# Patient Record
Sex: Female | Born: 1975 | Race: Black or African American | Hispanic: No | Marital: Single | Smoking: Current every day smoker
Health system: Southern US, Community
[De-identification: ages and names within clinical notes are randomized; demographics above are authoritative.]

## PROBLEM LIST (undated history)

## (undated) DIAGNOSIS — I499 Cardiac arrhythmia, unspecified: Secondary | ICD-10-CM

## (undated) DIAGNOSIS — E119 Type 2 diabetes mellitus without complications: Secondary | ICD-10-CM

## (undated) DIAGNOSIS — B009 Herpesviral infection, unspecified: Secondary | ICD-10-CM

## (undated) DIAGNOSIS — I480 Paroxysmal atrial fibrillation: Secondary | ICD-10-CM

## (undated) HISTORY — PX: TUBAL LIGATION: SHX77

## (undated) HISTORY — DX: Paroxysmal atrial fibrillation: I48.0

## (undated) HISTORY — DX: Herpesviral infection, unspecified: B00.9

---

## 1997-12-26 ENCOUNTER — Inpatient Hospital Stay (HOSPITAL_COMMUNITY): Admission: AD | Admit: 1997-12-26 | Discharge: 1997-12-26 | Payer: Self-pay | Admitting: *Deleted

## 1998-03-20 ENCOUNTER — Inpatient Hospital Stay (HOSPITAL_COMMUNITY): Admission: AD | Admit: 1998-03-20 | Discharge: 1998-03-20 | Payer: Self-pay | Admitting: *Deleted

## 1998-06-12 ENCOUNTER — Inpatient Hospital Stay (HOSPITAL_COMMUNITY): Admission: AD | Admit: 1998-06-12 | Discharge: 1998-06-12 | Payer: Self-pay | Admitting: *Deleted

## 1998-09-04 ENCOUNTER — Inpatient Hospital Stay (HOSPITAL_COMMUNITY): Admission: AD | Admit: 1998-09-04 | Discharge: 1998-09-04 | Payer: Self-pay | Admitting: *Deleted

## 1998-11-27 ENCOUNTER — Inpatient Hospital Stay (HOSPITAL_COMMUNITY): Admission: AD | Admit: 1998-11-27 | Discharge: 1998-11-27 | Payer: Self-pay | Admitting: *Deleted

## 1999-02-19 ENCOUNTER — Inpatient Hospital Stay (HOSPITAL_COMMUNITY): Admission: AD | Admit: 1999-02-19 | Discharge: 1999-02-19 | Payer: Self-pay | Admitting: *Deleted

## 1999-05-13 ENCOUNTER — Inpatient Hospital Stay (HOSPITAL_COMMUNITY): Admission: AD | Admit: 1999-05-13 | Discharge: 1999-05-13 | Payer: Self-pay | Admitting: *Deleted

## 1999-05-21 ENCOUNTER — Encounter: Admission: RE | Admit: 1999-05-21 | Discharge: 1999-05-21 | Payer: Self-pay | Admitting: Obstetrics & Gynecology

## 1999-11-28 ENCOUNTER — Inpatient Hospital Stay (HOSPITAL_COMMUNITY): Admission: AD | Admit: 1999-11-28 | Discharge: 1999-11-28 | Payer: Self-pay | Admitting: *Deleted

## 2000-02-13 ENCOUNTER — Encounter: Admission: RE | Admit: 2000-02-13 | Discharge: 2000-02-13 | Payer: Self-pay | Admitting: Hematology and Oncology

## 2000-02-25 ENCOUNTER — Inpatient Hospital Stay (HOSPITAL_COMMUNITY): Admission: AD | Admit: 2000-02-25 | Discharge: 2000-02-25 | Payer: Self-pay | Admitting: *Deleted

## 2000-06-19 ENCOUNTER — Inpatient Hospital Stay (HOSPITAL_COMMUNITY): Admission: AD | Admit: 2000-06-19 | Discharge: 2000-06-19 | Payer: Self-pay | Admitting: *Deleted

## 2000-07-21 ENCOUNTER — Emergency Department (HOSPITAL_COMMUNITY): Admission: EM | Admit: 2000-07-21 | Discharge: 2000-07-21 | Payer: Self-pay | Admitting: Emergency Medicine

## 2000-07-30 ENCOUNTER — Emergency Department (HOSPITAL_COMMUNITY): Admission: EM | Admit: 2000-07-30 | Discharge: 2000-07-30 | Payer: Self-pay | Admitting: Emergency Medicine

## 2000-10-21 ENCOUNTER — Inpatient Hospital Stay (HOSPITAL_COMMUNITY): Admission: AD | Admit: 2000-10-21 | Discharge: 2000-10-21 | Payer: Self-pay | Admitting: *Deleted

## 2000-10-22 ENCOUNTER — Emergency Department (HOSPITAL_COMMUNITY): Admission: EM | Admit: 2000-10-22 | Discharge: 2000-10-22 | Payer: Self-pay | Admitting: Emergency Medicine

## 2001-02-21 ENCOUNTER — Emergency Department (HOSPITAL_COMMUNITY): Admission: EM | Admit: 2001-02-21 | Discharge: 2001-02-21 | Payer: Self-pay | Admitting: Emergency Medicine

## 2001-03-10 ENCOUNTER — Inpatient Hospital Stay (HOSPITAL_COMMUNITY): Admission: RE | Admit: 2001-03-10 | Discharge: 2001-03-10 | Payer: Self-pay | Admitting: *Deleted

## 2001-08-02 ENCOUNTER — Other Ambulatory Visit: Admission: RE | Admit: 2001-08-02 | Discharge: 2001-08-02 | Payer: Self-pay | Admitting: Family Medicine

## 2002-02-02 ENCOUNTER — Emergency Department (HOSPITAL_COMMUNITY): Admission: EM | Admit: 2002-02-02 | Discharge: 2002-02-02 | Payer: Self-pay | Admitting: Emergency Medicine

## 2002-12-29 ENCOUNTER — Emergency Department (HOSPITAL_COMMUNITY): Admission: EM | Admit: 2002-12-29 | Discharge: 2002-12-29 | Payer: Self-pay

## 2003-02-19 ENCOUNTER — Emergency Department (HOSPITAL_COMMUNITY): Admission: EM | Admit: 2003-02-19 | Discharge: 2003-02-19 | Payer: Self-pay | Admitting: Emergency Medicine

## 2004-02-02 ENCOUNTER — Other Ambulatory Visit: Admission: RE | Admit: 2004-02-02 | Discharge: 2004-02-02 | Payer: Self-pay | Admitting: Obstetrics and Gynecology

## 2004-07-05 ENCOUNTER — Other Ambulatory Visit: Admission: RE | Admit: 2004-07-05 | Discharge: 2004-07-05 | Payer: Self-pay | Admitting: Obstetrics and Gynecology

## 2005-11-26 ENCOUNTER — Inpatient Hospital Stay (HOSPITAL_COMMUNITY): Admission: RE | Admit: 2005-11-26 | Discharge: 2005-11-29 | Payer: Self-pay | Admitting: Obstetrics and Gynecology

## 2005-11-26 ENCOUNTER — Encounter (INDEPENDENT_AMBULATORY_CARE_PROVIDER_SITE_OTHER): Payer: Self-pay | Admitting: Specialist

## 2006-02-03 ENCOUNTER — Other Ambulatory Visit: Admission: RE | Admit: 2006-02-03 | Discharge: 2006-02-03 | Payer: Self-pay | Admitting: Obstetrics and Gynecology

## 2007-01-18 ENCOUNTER — Encounter: Admission: RE | Admit: 2007-01-18 | Discharge: 2007-01-18 | Payer: Self-pay | Admitting: Family Medicine

## 2007-03-02 ENCOUNTER — Ambulatory Visit (HOSPITAL_COMMUNITY): Admission: RE | Admit: 2007-03-02 | Discharge: 2007-03-02 | Payer: Self-pay | Admitting: Obstetrics and Gynecology

## 2007-03-02 ENCOUNTER — Encounter (INDEPENDENT_AMBULATORY_CARE_PROVIDER_SITE_OTHER): Payer: Self-pay | Admitting: Obstetrics and Gynecology

## 2009-03-17 IMAGING — CT CT ABDOMEN W/O CM
3 of 4 series · 15 of 32 positions shown, 20 images · IV contrast (READICAT/WATER)
Comparison: none

CLINICAL DATA: Abdominal pain.  Recent onset UTI.

 ABDOMEN CT WITHOUT CONTRAST:
TECHNIQUE: Multidetector CT imaging of the abdomen was performed following the standard protocol without IV contrast.    Oral Gastrografin was administered.

[Series 2: abdomen w/ · axial · 0.70mm/px · z∈[-162,+23]mm · 4 of 63 slices shown, 9 images]
[im 13/63  soft-tissue]
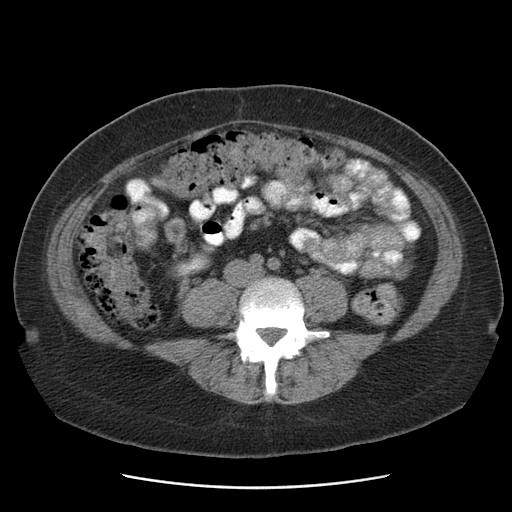
[im 13/63  lung]
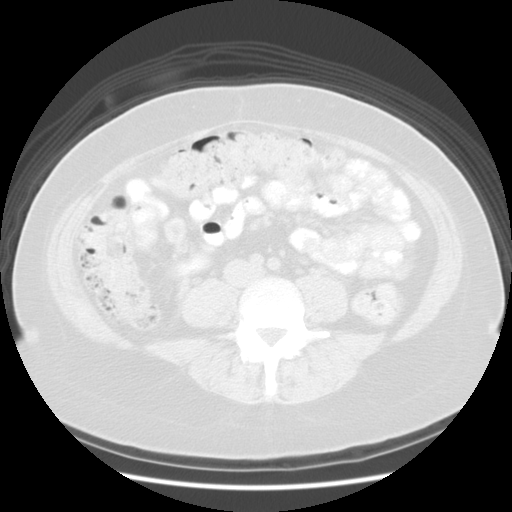
[im 13/63  bone]
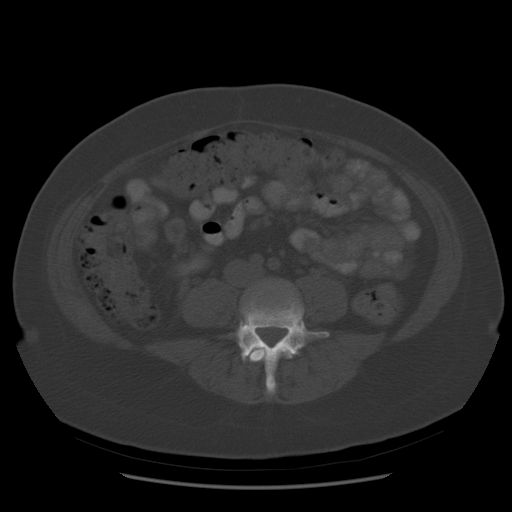
[im 25/63  soft-tissue]
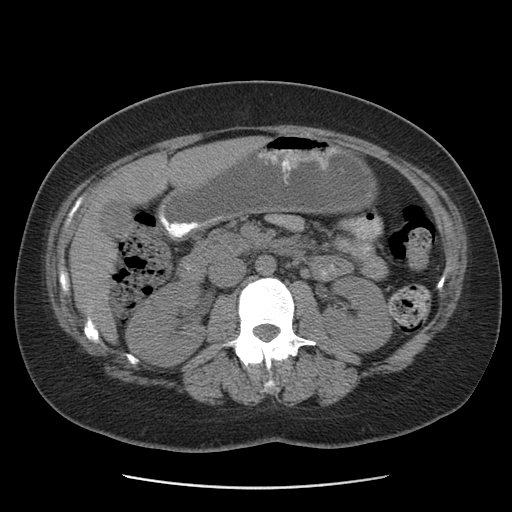
[im 25/63  lung]
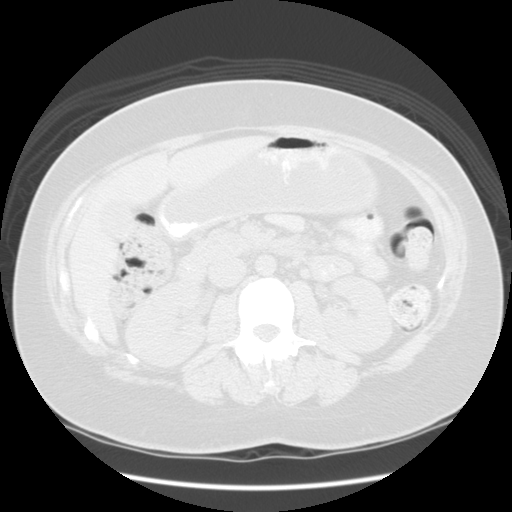
[im 38/63  soft-tissue]
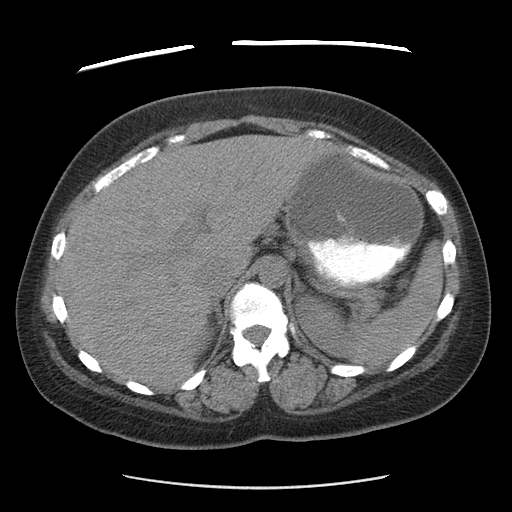
[im 38/63  lung]
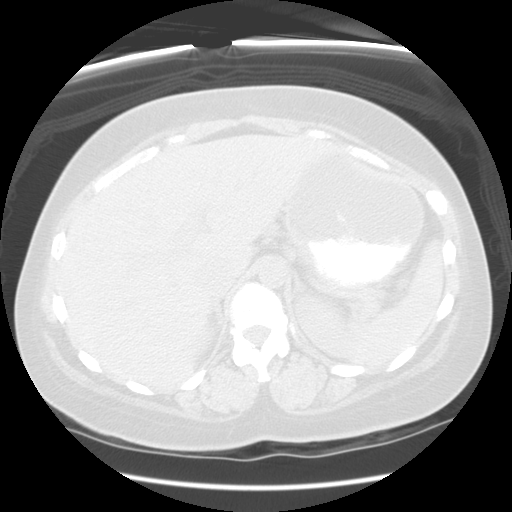
[im 50/63  soft-tissue]
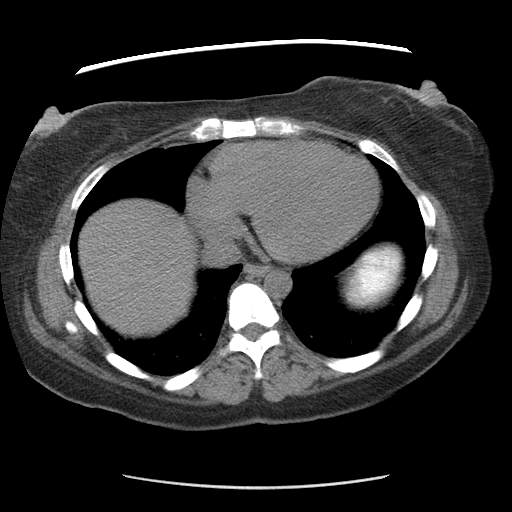
[im 50/63  lung]
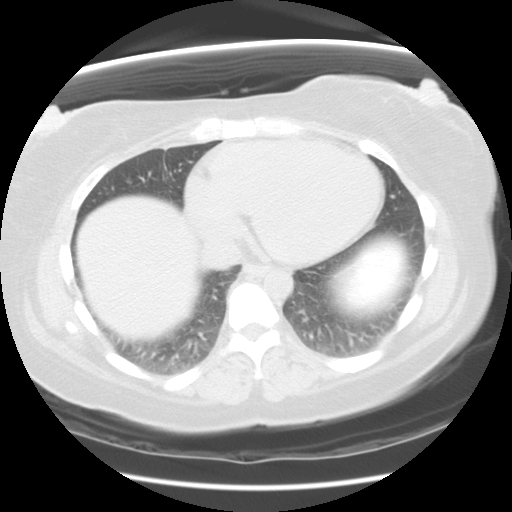

[Series 400: coronal · coronal · 0.70mm/px · 3 of 103 slices shown]
[im 12/103  soft-tissue]
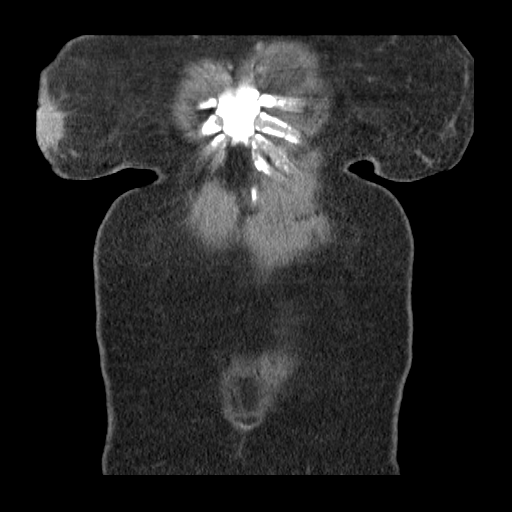
[im 23/103  soft-tissue]
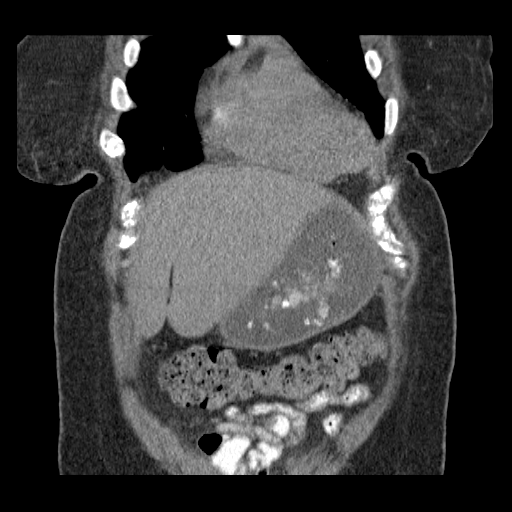
[im 35/103  soft-tissue]
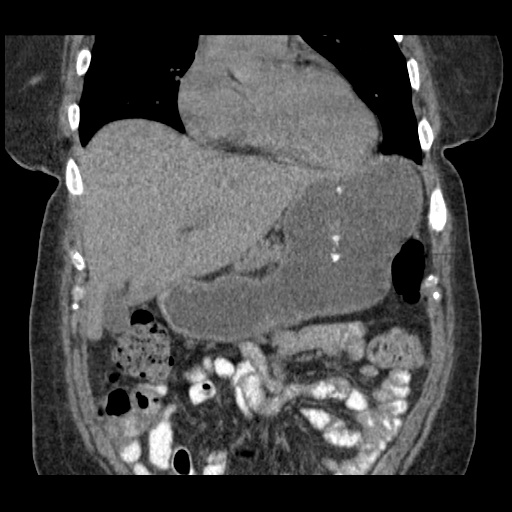

[Series 401: sagittal · sagittal · 0.70mm/px · 8 of 131 slices shown]
[im 11/131  soft-tissue]
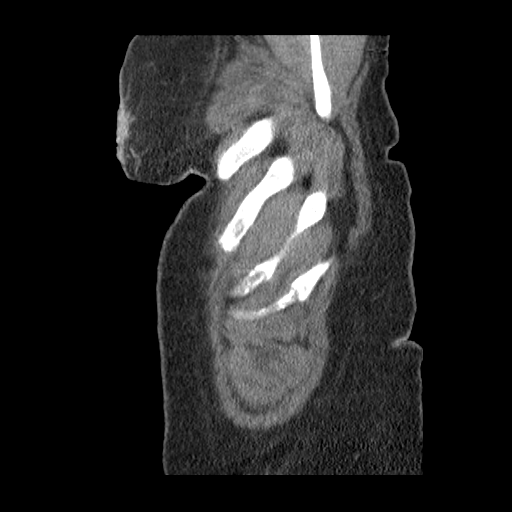
[im 33/131  soft-tissue]
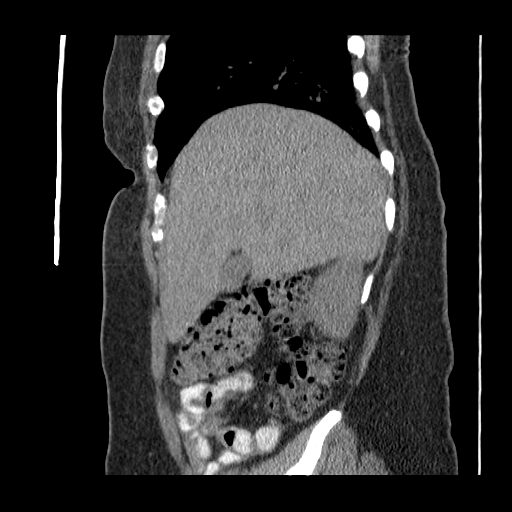
[im 44/131  soft-tissue]
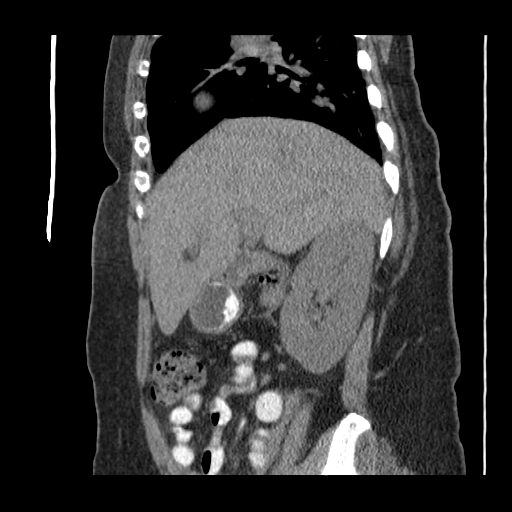
[im 55/131  soft-tissue]
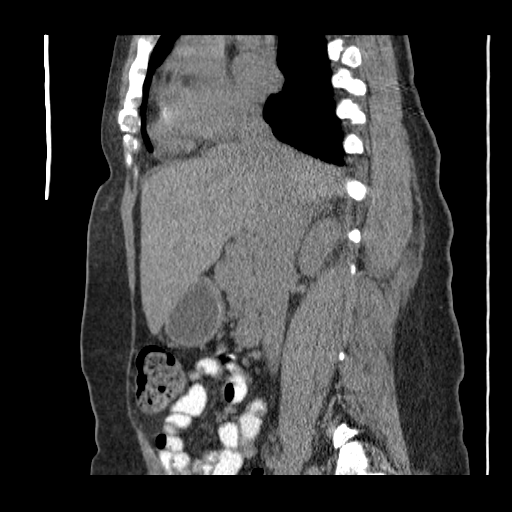
[im 76/131  soft-tissue]
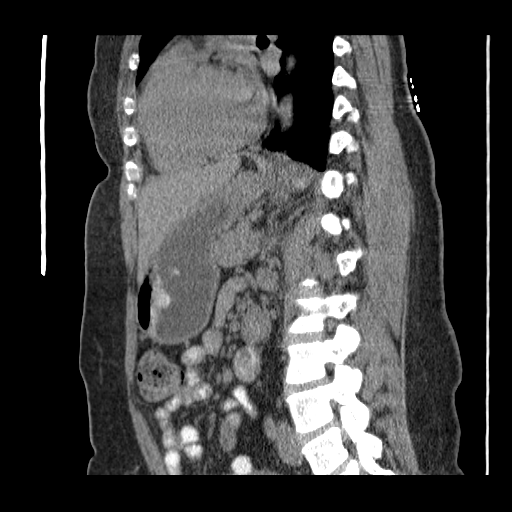
[im 87/131  soft-tissue]
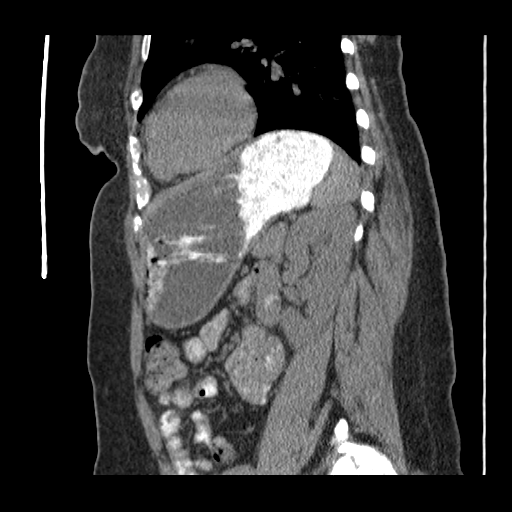
[im 98/131  soft-tissue]
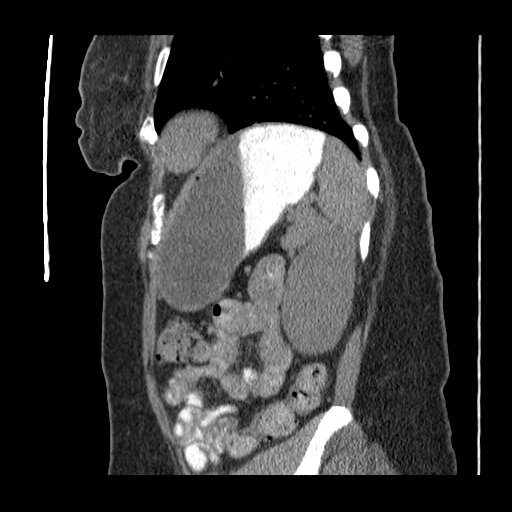
[im 120/131  soft-tissue]
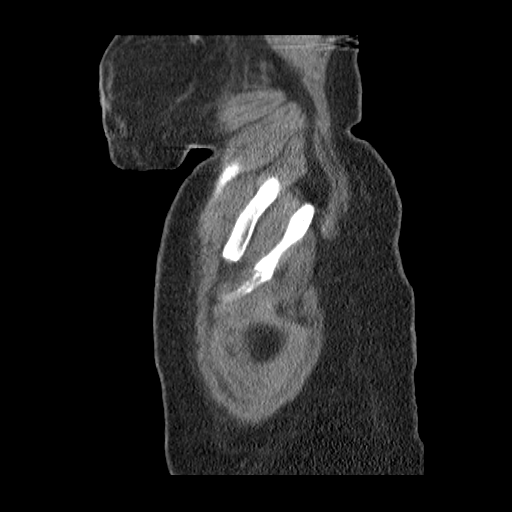

[15 of 32 positions shown; findings below may reference images not displayed]

FINDINGS: Unremarkable appearance of the liver, spleen, pancreas, kidneys, and adrenal glands.  No abdominal mass or abnormal fluid collection.  Generous amount of stool throughout the colon.
IMPRESSION: No acute abdominal findings.  Probable constipation.

## 2010-01-25 ENCOUNTER — Encounter: Admission: RE | Admit: 2010-01-25 | Discharge: 2010-01-25 | Payer: Self-pay | Admitting: Family Medicine

## 2011-02-11 NOTE — Op Note (Signed)
Wendy Ramsey, Wendy Ramsey               ACCOUNT NO.:  [id number]   MEDICAL RECORD NO.:  [id number]          PATIENT TYPE:  AMB   LOCATION:  SDC                           FACILITY:  [facility]   PHYSICIAN:  Naima A. Dillard, M.D. DATE OF BIRTH:  September 15, 1976   DATE OF PROCEDURE:  03/02/2007  DATE OF DISCHARGE:                               OPERATIVE REPORT   PREOPERATIVE DIAGNOSIS:  Menorrhagia and uterine fibroids.   POSTOPERATIVE DIAGNOSIS:  Menorrhagia and uterine fibroids.   PROCEDURE:  D&C hysteroscopy, ThermaChoice ablation.   SURGEON:  Naima A. Normand Sloop, M.D., no assistants.   ANESTHESIA:  General laryngeal mask airway.   SPECIMENS:  Endometrial curettings.   ESTIMATED BLOOD LOSS:  Minimal.   DEFICIT:  100 mL for hysteroscopic fluid.   COMPLICATIONS:  None.  The patient went to PACU in stable condition.   The patient was brought to the operating room and placed dorsal  lithotomy position, prepped and prepped and draped in normal sterile  fashion after she was put to sleep.  The bladder was drained with  straight catheter.  The uterus was noted to be about eight weeks size  anteverted with no adnexal masses.  A bivalve speculum placed into the  vagina.  The anterior lip of the cervix grasped with single-tooth  tenaculum.  The uterus did sound to 9 cm.  The cervix was dilated Pratt  dilators up to 21.  Hysteroscope was placed into the uterine cavity.  All four walls of the uterus were normal.  Both ostia were visualized.  The hysteroscope was removed, sharp endometrial curetting was done,  endometrial curettings were sent to pathology.  The ThermaChoice  ablation was done per protocol without difficulty.  Upon looking with  the hysteroscope, most of the ablation to take place, however, on both  sides of the cornua.  There were some pink tissue there.  All  instruments removed from the vagina.  The tenaculum sites on the  patient's left side was made hemostatic with silver nitrate  and  pressure.  Sponge, lap and needle counts were correct.  The patient to  recovery room in stable condition.      Naima A. Normand Sloop, M.D.  Electronically Signed     NAD/MEDQ  D:  03/02/2007  T:  03/02/2007  Job:  [id number]

## 2011-02-11 NOTE — H&P (Signed)
Wendy Ramsey, Wendy Ramsey               ACCOUNT NO.:  [id number]   MEDICAL RECORD NO.:  [id number]          PATIENT TYPE:  AMB   LOCATION:  SDC                           [facility]   PHYSICIAN:  Naima A. Dillard, M.D. DATE OF BIRTH:  Oct 19, 1975   DATE OF ADMISSION:  DATE OF DISCHARGE:                              HISTORY & PHYSICAL   DIAGNOSES:  Dysmenorrhea and menorrhagia.   Patient presented to me in May, 2007 complaining of heavy vaginal  bleeding status post C-section in February, 2007.  Her menses were every  month.  She said she soaked greater than a pad an hour and occasionally  felt dizzy with her menses.  She denied any history of bleeding  disorder.  A year later, the patient says her periods are ever 28-30  days and last for 5-7 days.  She does not change her pad quite as often,  changes them 4-5 times a day but she says it interferes with her normal  routine and occasionally has bleeding through her clothes.  She does  have some lower abdominal pain that is intermittent and occasionally  happens after urination.  The pain goes from an 8/10 to about a 3/10  with Motrin.  The patient denies any vaginal discharge, odor, fever.  She does have occasional pain with intercourse.  She denies any urgency  or dysuria.  She does have some urinary trace blood in her urine on UA  but not grossly.  Patient denies any history of kidney stones,  constipation, diarrhea, rectal bleeding, vomiting, nausea, pregnancy.  She does have fibroids, ultrasounds.  Denies any endometriosis.  Patient  has a history of ovarian cyst.  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   Patient had an ultrasound which demonstrated an uterus measuring 8.54 x  3.54 x 4.06.  Both ovaries were normal.  Patient had a small fibroid  measuring 1.24 cm.   PAST MEDICAL HISTORY:  Significant for herpes type II.   ALLERGIES:  HYDROCODONE, LASIX.   PAST SURGICAL HISTORY:  C-sections x2 and tubal ligation.   Patient does smoke  a half pack per day.  She denies any illegal drug  use.   FAMILY HISTORY:  Significant for diabetes and high blood pressure.   PERTINENT LABORATORY DATA:  Hemoglobin is 15.3.  Pap smear is found to  be within normal limits.  Urine pregnancy test was found to be negative.   REVIEW OF SYSTEMS:  Genitourinary is significant for menorrhagia,  occasional dysmenorrhea with pelvic pain.  MUSCULOSKELETAL:  There is no  weakness.  She does have some joint pain occasionally.  Endocrine is  unremarkable.  Psychiatric is unremarkable.   PHYSICAL EXAMINATION:  VITAL SIGNS:  Patient is 171 pounds.  Blood  pressure is 112/80.  HEENT:  Pupils are equal.  Ears are normal.  Throat is clear.  NECK:  Thyroid is not enlarged.  HEART:  Regular rate and rhythm.  LUNGS:  Clear to auscultation bilaterally.  BREASTS:  No masses, discharge, skin changes, or nipple retraction  bilaterally.  BACK:  No CVA tenderness bilaterally.  ABDOMEN:  Nontender without mass or organomegaly.  EXTREMITIES:  No clubbing, cyanosis or edema.  NEURO:  Within normal limits.  PELVIC:  Within normal limits.  Uterus normal shape, size, consistency,  and nontender.  Cervix is nontender without any lesions, but it is  stenotic.  Adnexa has no masses and nontender.   ASSESSMENT:  Menorrhagia, occasional pelvic pain.  Patient feels like  the bleeding interferes with her normal routine.  All treatments were  reviewed for the patient for having menses from observation to pain  medications to birth control pills, ablation, hysterectomy.  Patient  desired endometrial ablation.  She understands the risks are not limited  to bleeding, infection, damage to internal organs such as bowel,  bladder, major blood vessels, perforation of the uterus.  The patient  agrees and desires to proceed.      Naima A. Normand Sloop, M.D.  Electronically Signed     NAD/MEDQ  D:  03/02/2007  T:  03/02/2007  Job:  [id number]

## 2011-02-14 NOTE — Op Note (Signed)
Wendy Ramsey, Wendy Ramsey               ACCOUNT NO.:  [id number]   MEDICAL RECORD NO.:  [id number]          PATIENT TYPE:  INP   LOCATION:  9122                          FACILITY:  [facility]   PHYSICIAN:  Naima A. Dillard, M.D. DATE OF BIRTH:  Oct 11, 1975   DATE OF PROCEDURE:  11/26/2005  DATE OF DISCHARGE:                                 OPERATIVE REPORT   PREOPERATIVE DIAGNOSES:  Term. Desires repeat cesarean section and  sterilization.   POSTOPERATIVE DIAGNOSES:  Term. Desires repeat cesarean section and  sterilization.   OPERATION/PROCEDURE:  1.  Repeat cesarean section.  2.  Bilateral tubal ligation.   SURGEON:  Naima A. Normand Sloop, M.D.   ASSISTANT:  Elby Showers. Williams, C.N.M.   ANESTHESIA:  Spinal.   SPECIMENS:  Placenta sent to labor and delivery.   OPERATIVE FINDINGS:  The patient had a female infant, Apgars of 8 and 9 and  normal abdominal anatomy and tubes and ovaries bilaterally.   ESTIMATED BLOOD LOSS:  600 mL.   URINARY OUTPUT:  400 mL clear urine at the end of the procedure.   IV FLUIDS:  2800 mL crystalloid.   COMPLICATIONS:  None.   CONDITION:  The patient went to PACU in stable condition.   DESCRIPTION OF PROCEDURE:  The patient was taken to the operating room.  She  was given spinal anesthesia, placed in the dorsal supine position with a  left lateral tilt.  The patient was prepped and draped in the normal sterile  fashion.  Foley catheter was placed.  Once her anesthesia was found to be  adequate, Pfannenstiel skin incision was made along per prior incision and  carried down to the with the scalpel and carried down to the fascia.  The  fascia was incised in the midline and extended bilaterally.  Kochers x2 were  placed in the superior aspect of the fascia which was dissected off the  rectus muscles both sharply and bluntly.  The superior aspect of the fascia  was dissected off the rectus muscle in a similar fashion.  The muscles were  separated in the midline.   The peritoneum was identified, tented up and  entered sharply and muscles separated in the midline.  The peritoneum was  extended superiorly and inferior with good visualization of bowel and  bladder.  The bladder blade was inserted. Vesicouterine peritoneum  was  identified, tented up sharply and extended bilaterally.  The bladder blade  was reinserted and low transverse uterine incision was made with the scalpel  and extended bluntly.  The amniotic sac was ruptured using forceps.  The  infant was delivered without difficulty.  Mouth and nares were bulb-  suctioned.  There was clear fluid.  Cord was clamped and cut.  The placenta  was sent for cord blood donation.  The placenta was manually delivered  without difficulty. The uterus was cleared of all clot and debris.  Uterine  incision was repaired with 0 Vicryl in a running locked fashion.  A second  layer of 0 Vicryl was used to imbricate the uterus.  Hemostasis was assured.   Attention  was then turned to the tubes.  The patient's left fallopian tube  was grasped and about 1.5 cm of mid isthmic portion of the tube was ligated  with 2-0 plain and excised.  Hemostasis was assured.  The patient's right  fallopian tube was grasped in the similar fashion and about 1.5 cm of the  mid isthmic portion of the tube was ligated with 2-0 plain and excised.  Irrigation was done.   There was some bleeding along the midline of the uterine incision which was  repaired with 0 Vicryl figure-of-eight stitch.  Hemostasis was assured.  Irrigation was then done again.  The peritoneum was closed with 0 chromic.  The fascia was closed with 0 Vicryl in a running fashion.  The subcutaneous  tissue was made hemostatic with the Bovie cautery after irrigation and  reapproximated with 2-0 plain.  Jackson-Pratt drain was placed.  Skin was  closed with 3-0 Monocryl in subcuticular fashion.  Sponge, lap and needle  counts were correct x2.  The patient went to the  recovery room in stable  condition.      Naima A. Normand Sloop, M.D.  Electronically Signed     NAD/MEDQ  D:  11/26/2005  T:  11/27/2005  Job:  [id number]

## 2011-02-14 NOTE — H&P (Signed)
Wendy Ramsey, Wendy Ramsey               ACCOUNT NO.:  [id number]   MEDICAL RECORD NO.:  [id number]          PATIENT TYPE:  INP   LOCATION:  NA                            [facility]   PHYSICIAN:  Naima A. Dillard, M.D. DATE OF BIRTH:  May 18, 1976   DATE OF ADMISSION:  DATE OF DISCHARGE:                                HISTORY & PHYSICAL   The patient is a 35 year old gravida 2, para 1-0-0-1, with a due date of  December 04, 2005, by 14-week ultrasound presenting for repeat cesarean section  and tubal ligation.  The patient's pregnancy has been complicated by history  of a previous cesarean section and the patient has decided not to have a  vaginal birth after cesarean section and proceed with repeat cesarean  section.  She also does not want anymore children and has desired a tubal  ligation for birth control.  The patient has a history of HSV II and she is  currently on Valtrex prophylaxis.  The patient is GBS positive.  The patient  is also a smoker and we encouraged tobacco cessation.   PRENATAL LABORATORY DATA:  HIV was nonreactive.  RPR was nonreactive.  The  patient has O positive blood type with antibodies negative.  She is rubella  immune.  She declined a quad screen.  Hepatitis B surface antigen was  nonreactive.  Sickle cell was negative.  Hemoglobin 12.5, platelets 321.  Gonorrhea and Chlamydia are both negative.  The patient declines quad  screen.  Group B Strep was positive.   PAST OBSTETRICAL HISTORY:  Significant for October of 1997 the patient had a  female infant weighing 6 pounds 5 ounces at term.  She had a cesarean section  for nonreassuring fetal heart tones.   PAST GYN HISTORY:  Significant for menarche at age 82 occurring every month  and lasting for 5 days.  The patient says she has a history of Trichomonas  and yeast infections and also herpes.  The patient denies any history of  abnormal Pap smear.   ALLERGIES:  LATEX sensitivity.   MEDICATIONS:  Valtrex and  prenatal vitamins.   FAMILY HISTORY:  Significant for maternal grandmother with hypertension and  maternal grandmother with type 2 diabetes.   GENETIC HISTORY:  Unremarkable.   PHYSICAL EXAMINATION:  VITAL SIGNS:  The patient is 5 feet 4 inches and  weighs 205 pounds.  Blood pressure 120/80.  ABDOMEN:  Gravid, soft, and nontender measuring 38 cm.  SKIN:  Normal.  NEUROLOGY:  Within normal limits.  EXTREMITIES:  Trace edema with no cyanosis or clubbing bilaterally on lower  extremities.  HEENT:  Head is normocephalic and atraumatic.  Thyroid is not enlarged.  BREASTS:  No masses bilaterally.  CARDIOVASCULAR:  Regular rate and rhythm.  LUNGS:  Clear to auscultation bilaterally.  PELVIC:  Last cervical examination was long and closed.  Uterus measured 38  cm.   ASSESSMENT:  Pregnancy at 38-6/7 weeks, desires repeat cesarean section and  tubal ligation.  The patient understands the risks are, but not limited to  bleeding, infection, damage to internal organs such  as bowel, bladder, and  major blood vessels.  The patient understands that the risks of tubal  ligation are failure rate of about 1 in 200 to 1 in 300 and half of those  could result in ectopic pregnancy.  The patient still desires to proceed.      Naima A. Normand Sloop, M.D.  Electronically Signed     NAD/MEDQ  D:  11/25/2005  T:  11/25/2005  Job:  [id number]

## 2011-02-14 NOTE — H&P (Signed)
[facility]  Patient:    Wendy Ramsey, Wendy Ramsey                      MRN: [id number] Adm. Date:  [id number] Disc. Date: [id number] Attending:  Deniece Ree                         History and Physical  HISTORY OF PRESENT ILLNESS:   This is a 35 year old separated white female gravida 0, para 0 who has been having chronic right lower quadrant pain since February of 1999. She had a complete GI evaluation with a colonoscopy and upper GI series, and they were normal. She had a laparoscopy done which showed widespread endometriosis. She had treatment with Lupron for two months which brought her no relief. She has gone to the pain center at [facility] since January of 2000. She has pain that is so severe that she feels like she cannot function, and the pain center has helped her significantly. She had a pelvic ultrasound by me on July 16 which showed a 3.8 x 2.1 cm mass in the left adnexa with shadowing consistent with a dermoid cyst. She had a much smaller cyst 11 x 12 on the right adnexa. The uterus was normal. The options for therapy were discussed with this patient, a repeat laparoscopy with left oophorectomy or TAH/BSO in light of her endometriosis. The patient elected conservative laparoscopy approach with a laparoscopic LSO and then surgery later for the TAH if she continues to have pain.  PAST MEDICAL HISTORY:         Positive for severe depression under therapy and chronic low back pain. She also has had three previous laparoscopies and an arthroscopy.  ALLERGIES:                    PENICILLIN, SULFA DRUGS, BUPRENEX, and ULTRAM.  SOCIAL HISTORY:               She smokes one to one and a half packs per day.  PHYSICAL EXAMINATION:  VITAL SIGNS:                  Normal.  HEENT:                        Within normal limits.  LUNGS:                        Clear to A&P.  HEART:                        Regular rate and rhythm without murmurs, rubs, or  gallops.  ABDOMEN:                      Soft, flat, and nontender.  EXTREMITIES:                  Normal.  PELVIC:                       External vagina, cervix normal. On bimanual, the uterus was anterior, small, mobile, and mildly tender. The adnexa were without masses, but both were also mildly tender. The known pelvic mass could not be appreciated on exam.  ASSESSMENT:  A 4 cm left adnexal mass, possible dermoid cyst with chronic pelvic pain and endometriosis found on previous laparoscopy.  PLAN:                         Laparoscopy with left salpingo-oophorectomy, possible laser vaporization of endometriosis.DD:  05/08/00 TD:  05/08/00 Job: [id number]

## 2011-02-14 NOTE — Discharge Summary (Signed)
Wendy Ramsey, Wendy Ramsey               ACCOUNT NO.:  [id number]   MEDICAL RECORD NO.:  [id number]          PATIENT TYPE:  INP   LOCATION:  9122                          FACILITY:  [facility]   PHYSICIAN:  Naima A. Dillard, M.D. DATE OF BIRTH:  1976/02/04   DATE OF ADMISSION:  11/26/2005  DATE OF DISCHARGE:  11/29/2005                                 DISCHARGE SUMMARY   ADMITTING DIAGNOSES:  1.  Intrauterine pregnancy at 46 and 6/7 weeks.  2.  Desires repeat cesarean section and tubal ligation.   DISCHARGE DIAGNOSES:  1.  Intrauterine pregnancy at 59 and 6/7 weeks.  2.  Desires repeat cesarean section and tubal ligation.   PROCEDURES:  1.  Repeat transverse cesarean section.  2.  Bilateral tubal ligation.   HOSPITAL COURSE:  Wendy Ramsey is a 35 year old gravida 2, para 1-0-0-1 with an  EDC of December 04, 2005, which was determined by a 14-week ultrasound.  She was  admitted for repeat cesarean section and she also desired sterilization.  Her pregnancy has been followed by the [facility] M.D[facility]  and has been remarkable for:  1.  History of previous cesarean section.  2.  Desires tubal ligation.  3.  History of HSV 2 and on Valtrex prophylaxis.  4.  Group B strep positive.  5.  Smoker.   The patient tolerated the cesarean section well.  Infant was a viable female  with Apgars of 8 and 9, and weight of 5 pounds and 4 ounces.  Infant was  taken to the full-term nursery in good condition.  The patient tolerated the  cesarean section, as well as the tubal ligation without any problems.  She  was taken to the recovery room and was doing well.  By postop day #1, her  hemoglobin was 11.3 and had been 13.8 preoperatively.  She was ambulating  without difficulty.  Baby was bottle feeding.  By postop day #2, her JP  drain had only 15 mL of drainage in 24 hours.  The patient continued to do  well.  By postop day #3, she had some blood pressures of 128/86 and 124/81.  Her other vital  signs were stable and she was afebrile.  Her incision was  clean and dry.  Her JP drain was removed.  She was deemed receive the full  benefit of her hospital stay and was discharged home.   DISCHARGE INSTRUCTIONS:  Per [facility] handout.   DISCHARGE MEDICATIONS:  1.  Motrin 600 mg 1 p.o. q.6h. p.r.n. pain.  2.  Tylox 1-2 p.o. q.3-4h. p.r.n. pain.   DISCHARGE FOLLOWUP:  Will occur at [facility] in 4-6 weeks or  as needed.      Cam Hai, C.N.M.      Naima A. Normand Sloop, M.D.  Electronically Signed    KS/MEDQ  D:  11/29/2005  T:  11/29/2005  Job:  [id number]

## 2011-07-17 LAB — CBC
HCT: 39.8
Hemoglobin: 13.6
MCHC: 34.2
MCV: 94.8
Platelets: 310
RBC: 4.21
RDW: 12.8
WBC: 5.7

## 2011-07-17 LAB — PREGNANCY, URINE: Preg Test, Ur: NEGATIVE

## 2011-09-26 ENCOUNTER — Ambulatory Visit (INDEPENDENT_AMBULATORY_CARE_PROVIDER_SITE_OTHER): Payer: BC Managed Care – PPO

## 2011-09-26 DIAGNOSIS — L299 Pruritus, unspecified: Secondary | ICD-10-CM

## 2012-07-05 ENCOUNTER — Encounter: Payer: Self-pay | Admitting: Obstetrics and Gynecology

## 2012-07-05 ENCOUNTER — Telehealth: Payer: Self-pay | Admitting: Obstetrics and Gynecology

## 2012-07-05 ENCOUNTER — Ambulatory Visit (INDEPENDENT_AMBULATORY_CARE_PROVIDER_SITE_OTHER): Payer: BC Managed Care – PPO | Admitting: Obstetrics and Gynecology

## 2012-07-05 VITALS — BP 118/64 | HR 82 | Wt 192.0 lb

## 2012-07-05 DIAGNOSIS — R309 Painful micturition, unspecified: Secondary | ICD-10-CM

## 2012-07-05 DIAGNOSIS — R300 Dysuria: Secondary | ICD-10-CM

## 2012-07-05 DIAGNOSIS — B9689 Other specified bacterial agents as the cause of diseases classified elsewhere: Secondary | ICD-10-CM

## 2012-07-05 DIAGNOSIS — N760 Acute vaginitis: Secondary | ICD-10-CM

## 2012-07-05 DIAGNOSIS — A499 Bacterial infection, unspecified: Secondary | ICD-10-CM

## 2012-07-05 LAB — POCT WET PREP (WET MOUNT)
Bacteria Wet Prep HPF POC: POSITIVE
Clue Cells Wet Prep Whiff POC: POSITIVE
pH: 5

## 2012-07-05 LAB — POCT URINALYSIS DIPSTICK
Bilirubin, UA: NEGATIVE
Blood, UA: 3
Glucose, UA: NEGATIVE
Ketones, UA: NEGATIVE
Nitrite, UA: NEGATIVE
Protein, UA: 2
Spec Grav, UA: 1.015
Urobilinogen, UA: NEGATIVE
pH, UA: 5

## 2012-07-05 LAB — POCT URINE PREGNANCY: Preg Test, Ur: NEGATIVE

## 2012-07-05 MED ORDER — TINIDAZOLE 500 MG PO TABS: 2.0000 g | ORAL_TABLET | Freq: Every day | ORAL | Status: DC

## 2012-07-05 NOTE — Telephone Encounter (Signed)
Tc to pt regarding msg, lm on vm to call back. 

## 2012-07-05 NOTE — Progress Notes (Signed)
C/o not voiding normally  Filed Vitals:   07/05/12 1539  BP: 118/64  Pulse: 82   Results for orders placed in visit on 07/05/12  POCT URINALYSIS DIPSTICK      Component Value Range   Color, UA yellow     Clarity, UA       Glucose, UA neg     Bilirubin, UA neg     Ketones, UA neg     Spec Grav, UA 1.015     Blood, UA 3     pH, UA 5.0     Protein, UA 2     Urobilinogen, UA negative     Nitrite, UA neg     Leukocytes, UA moderate (2+)     ROS: noncontributory  Pelvic exam:  VULVA: normal appearing vulva with no masses, tenderness or lesions,  VAGINA: normal appearing vagina with normal color and discharge, no lesions, white d/c CERVIX: normal appearing cervix without discharge or lesions,  UTERUS: uterus is normal size, shape, consistency and nontender,  ADNEXA: normal adnexa in size, nontender and no masses.  Results for orders placed in visit on 07/05/12  POCT URINALYSIS DIPSTICK      Component Value Range   Color, UA yellow     Clarity, UA       Glucose, UA neg     Bilirubin, UA neg     Ketones, UA neg     Spec Grav, UA 1.015     Blood, UA 3     pH, UA 5.0     Protein, UA 2     Urobilinogen, UA negative     Nitrite, UA neg     Leukocytes, UA moderate (2+)    POCT URINE PREGNANCY      Component Value Range   Preg Test, Ur Negative    POCT WET PREP (WET MOUNT)      Component Value Range   Source Wet Prep POC       WBC, Wet Prep HPF POC       Bacteria Wet Prep HPF POC pos     BACTERIA WET PREP MORPHOLOGY POC       Clue Cells Wet Prep HPF POC Moderate     CLUE CELLS WET PREP WHIFF POC Positive Whiff     Yeast Wet Prep HPF POC       KOH Wet Prep POC       Trichomonas Wet Prep HPF POC       pH 5.0        A/P Wet prep - BV - tindamax Urine Cx AEX

## 2012-09-16 ENCOUNTER — Ambulatory Visit: Payer: Self-pay | Admitting: Obstetrics and Gynecology

## 2012-10-11 ENCOUNTER — Encounter: Payer: Self-pay | Admitting: Obstetrics and Gynecology

## 2012-10-11 ENCOUNTER — Ambulatory Visit (INDEPENDENT_AMBULATORY_CARE_PROVIDER_SITE_OTHER): Payer: BC Managed Care – PPO | Admitting: Obstetrics and Gynecology

## 2012-10-11 VITALS — BP 118/76 | Ht 62.0 in | Wt 195.0 lb

## 2012-10-11 DIAGNOSIS — Z124 Encounter for screening for malignant neoplasm of cervix: Secondary | ICD-10-CM

## 2012-10-11 DIAGNOSIS — Z01419 Encounter for gynecological examination (general) (routine) without abnormal findings: Secondary | ICD-10-CM

## 2012-10-11 DIAGNOSIS — N921 Excessive and frequent menstruation with irregular cycle: Secondary | ICD-10-CM

## 2012-10-11 MED ORDER — VALACYCLOVIR HCL 500 MG PO TABS: 500.0000 mg | ORAL_TABLET | Freq: Two times a day (BID) | ORAL | Status: DC

## 2012-10-11 NOTE — Patient Instructions (Signed)
Metrorrhagia   Metrorrhagia is uterine bleeding at irregular intervals, especially between menstrual periods.   CAUSES    Dysfunctional uterine bleeding.   Uterine lining growing outside the uterus (endometriosis).   Embryo adhering to uterine wall (implantation).   Pregnancy growing in the fallopian tubes (ectopic pregnancy).   Miscarriage.   Menopause.   Cancer of the reproduction organs.   Certain drugs such as hormonal contraceptives.   Inherited bleeding disorders.   Trauma.   Uterine fibroids.   Sexually transmitted diseases (STDs).   Polycystic ovarian disease.  DIAGNOSIS   A history will be taken.   A physical exam will be performed.   Other tests may include:   Blood tests.   A pregnancy test.   An ultrasound of the abdomen and pelvis.   A biopsy of the uterine lining.   AMRI or CT scan of the abdomen and pelvis.  TREATMENT  Treatment will depend on the cause.  HOME CARE INSTRUCTIONS    Take all medicines as directed by your caregiver. Do not change or switch medicines without talking to your caregiver.   Take all iron supplements exactly as directed by your caregiver. Iron supplements help to replace the iron your body loses from irregular bleeding.If you become constipated, increase the amount of fiber, fruits, and vegetables in your diet.   Do not take aspirin or medicines that contain aspirin for 1 week before your menstrual period or during your menstrual period. Aspirin may increase the bleeding.   Rest as much as possible if you change your sanitary pad or tampon more than once every 2 hours.   Eat well-balanced meals including foods high in iron, such as green leafy vegetables, red meat, liver, eggs, and whole-grain breads and cereals.   Do not try to lose weight until the abnormal bleeding is controlled and your blood iron level is back to normal.  SEEK MEDICAL CARE IF:    You have nausea and vomiting, or you cannot keep foods down.   You feel dizzy or have diarrhea  while taking medicine.   You have any problems that may be related to the medicine you are taking.  SEEK IMMEDIATE MEDICAL CARE IF:    You have a fever.   You develop chills.   You become lightheaded or faint.   You need to change your sanitary pad or tampon more than once an hour.   Your bleeding becomesheavy.   You begin to pass clots or tissue.  MAKE SURE YOU:    Understand these instructions.   Will watch your condition.   Will get help right away if you are not doing well or get worse.  Document Released: 09/15/2005 Document Revised: 12/08/2011 Document Reviewed: 04/14/2011  ExitCare Patient Information 2013 [employer].

## 2012-10-11 NOTE — Progress Notes (Signed)
Last Pap: 12/12  WNL: Yes Regular Periods:yes Contraception: tubal  Monthly Breast exam:yes Tetanus<19yrs:yes Nl.Bladder Function:yes Daily BMs:yes Healthy Diet:yes Calcium:yes Mammogram:no Date of Mammogram: n/a Exercise:no Have often Exercise: n/a Seatbelt: yes Abuse at home: no Stressful work:no Sigmoid-colonoscopy: n/a Bone Density: No PCP: Dr.Bland Change in PMH: no change Change in FMH:no change BP 118/76  Ht 5\' 2"  (1.575 m)  Wt 195 lb (88.451 kg)  BMI 35.67 kg/m2  LMP 09/15/2012 Pt with complaints:yes and she has period that lasts 3 days.  Then continues to spot for 5-7 days later.  No pain.   occ odor Physical Examination: General appearance - alert, well appearing, and in no distress Mental status - normal mood, behavior, speech, dress, motor activity, and thought processes Neck - supple, no significant adenopathy,  thyroid exam: thyroid is normal in size without nodules or tenderness Chest - clear to auscultation, no wheezes, rales or rhonchi, symmetric air entry Heart - normal rate and regular rhythm Abdomen - soft, nontender, nondistended, no masses or organomegaly Breasts - breasts appear normal, no suspicious masses, no skin or nipple changes or axillary nodes Pelvic - normal external genitalia, vulva, vagina, cervix, uterus and adnexa Rectal - rectal exam not indicated Back exam - full range of motion, no tenderness, palpable spasm or pain on motion Neurological - alert, oriented, normal speech, no focal findings or movement disorder noted Musculoskeletal - no joint tenderness, deformity or swelling Extremities - no edema, redness or tenderness in the calves or thighs Skin - normal coloration and turgor, no rashes, no suspicious skin lesions noted Routine exam Metrorrhagia  Pap sent yes Mammogram due no BTL used for contraception RT 4 weeks for SHG and EMBX.  Pt with metrorrhagia.  Try rephresh prn

## 2012-10-12 LAB — PAP IG W/ RFLX HPV ASCU

## 2012-11-10 ENCOUNTER — Other Ambulatory Visit: Payer: Self-pay

## 2012-11-10 DIAGNOSIS — N921 Excessive and frequent menstruation with irregular cycle: Secondary | ICD-10-CM

## 2012-11-11 ENCOUNTER — Encounter: Payer: BC Managed Care – PPO | Admitting: Obstetrics and Gynecology

## 2013-02-25 ENCOUNTER — Other Ambulatory Visit (HOSPITAL_COMMUNITY)
Admission: RE | Admit: 2013-02-25 | Discharge: 2013-02-25 | Disposition: A | Discharge status: Home / Self Care | Payer: BC Managed Care – PPO | Source: Ambulatory Visit | Attending: Family Medicine | Admitting: Family Medicine

## 2013-02-25 ENCOUNTER — Other Ambulatory Visit: Payer: Self-pay | Admitting: Family Medicine

## 2013-02-25 DIAGNOSIS — Z1151 Encounter for screening for human papillomavirus (HPV): Secondary | ICD-10-CM | POA: Insufficient documentation

## 2013-02-25 DIAGNOSIS — Z01419 Encounter for gynecological examination (general) (routine) without abnormal findings: Secondary | ICD-10-CM | POA: Insufficient documentation

## 2013-12-01 ENCOUNTER — Other Ambulatory Visit: Payer: Self-pay | Admitting: Obstetrics and Gynecology

## 2013-12-13 ENCOUNTER — Ambulatory Visit: Payer: BC Managed Care – PPO | Attending: Gynecology | Admitting: Gynecology

## 2013-12-13 ENCOUNTER — Encounter: Payer: Self-pay | Admitting: Gynecology

## 2013-12-13 VITALS — BP 140/82 | HR 92 | Temp 98.5°F | Ht 62.09 in | Wt 198.3 lb

## 2013-12-13 DIAGNOSIS — N83209 Unspecified ovarian cyst, unspecified side: Secondary | ICD-10-CM | POA: Insufficient documentation

## 2013-12-13 DIAGNOSIS — R1900 Intra-abdominal and pelvic swelling, mass and lump, unspecified site: Secondary | ICD-10-CM

## 2013-12-13 DIAGNOSIS — F17200 Nicotine dependence, unspecified, uncomplicated: Secondary | ICD-10-CM | POA: Insufficient documentation

## 2013-12-13 DIAGNOSIS — N83202 Unspecified ovarian cyst, left side: Secondary | ICD-10-CM

## 2013-12-13 NOTE — Progress Notes (Signed)
Consult Note: Gyn-Onc   Wendy Ramsey 38 y.o. female  Chief Complaint  Patient presents with  . 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 Redmond Baseman care and recommend that the patient undergo a laparoscopic left salpingo-oophorectomy to be have some certain that this is not a malignant neoplasm.  HPI: 38 year old African American female seen in consultation request of Dr. Normand Sloop regarding management of a newly diagnosed complex left adnexal mass. The patient presented to Dr. Normand Sloop with irregular spotting after each menstrual period. She claims to have  had no other gynecologic symptoms. She underwent went an ultrasound to evaluate the uterus which showed essentially normal uterus but a left ovarian cyst which was complex with irregular borders measuring 4.6 x 3.9 x 3 cm. There was no internal blood flow.  An endometrial biopsy has been obtained revealing benign secretory endometrium and a Pap smear was normal. CA 125 was 4.3 units per mL.  The patient reports she does not have any symptoms associated with this left ovarian cyst specifically pain or pressure.  Past gynecologic history: Cesarean section x2.  Review of Systems:10 point review of systems is negative except as noted in interval history.   Vitals: Blood pressure 140/82, pulse 92, temperature 98.5 F (36.9 C), temperature source Oral, height 5' 2.09" (1.577 m), weight 198 lb 4.8 oz (89.948 kg).  Physical Exam: General : The patient is a healthy woman in no acute distress.  HEENT: normocephalic, extraoccular movements normal; neck is supple without thyromegally  Lynphnodes: Supraclavicular and inguinal nodes not enlarged  Abdomen: Soft, non-tender, no ascites, no organomegally, no masses, no hernias  Pelvic:  EGBUS: Normal female  Vagina: Normal, no lesions  Urethra and Bladder: Normal,  non-tender  Cervix: Normal Uterus: Anterior normal shape size and consistency  Bi-manual examination: Non-tender; no adenxal masses or nodularity  Rectal: normal sphincter tone, no masses, no blood  Lower extremities: No edema or varicosities. Normal range of motion      No Known Allergies  Past Medical History  Diagnosis Date  . 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 Cesarean section      2 times   . Tubal ligation      Current Outpatient Prescriptions  Medication Sig Dispense Refill  . valACYclovir (VALTREX) 500 MG tablet Take 1 tablet (500 mg total) by mouth 2 (two) times daily.  6 tablet  6  . tinidazole (TINDAMAX) 500 MG tablet Take 4 tablets (2,000 mg total) by mouth daily. For 2 days.  8 tablet  0   No current facility-administered medications for this visit.    History   Social History  . Marital Status: Single    Spouse Name: N/A    Number of Children: N/A  . Years of Education: N/A   Occupational History  . Not on file.   Social History Main Topics  . Smoking status: Current Every Day Smoker  . Smokeless tobacco: Never Used  . Alcohol Use: No  . Drug Use: No  . Sexual Activity: Yes     Comment: BTL   Other Topics Concern  . Not on file   Social History Narrative  . No narrative on file    Family History  Problem Relation Age of Onset  .  Diabetes Maternal Grandmother   . Cancer Maternal Grandfather   . Cancer Father   . Diabetes Mother       Jeannette CorpusCLARKE-PEARSON,Audre Cenci L, MD 12/13/2013, 2:48 PM       Consult Note: Gyn-Onc   Wendy BuddyShekita C Ramsey 38 y.o. female  Chief Complaint  Patient presents with  . Complex cystic mass    New consult    Assessment :  Plan:  Interval History:   HPI:  Review of Systems:10 point review of systems is negative except as noted in interval history.   Vitals: Blood pressure 140/82, pulse 92, temperature 98.5 F (36.9 C),  temperature source Oral, height 5' 2.09" (1.577 m), weight 198 lb 4.8 oz (89.948 kg).  Physical Exam: General : The patient is a healthy woman in no acute distress.  HEENT: normocephalic, extraoccular movements normal; neck is supple without thyromegally  Lynphnodes: Supraclavicular and inguinal nodes not enlarged  Abdomen: Soft, non-tender, no ascites, no organomegally, no masses, no hernias  Pelvic:  EGBUS: Normal female  Vagina: Normal, no lesions  Urethra and Bladder: Normal, non-tender  Cervix: Surgically absent  Uterus: Surgically absent  Bi-manual examination: Non-tender; no adenxal masses or nodularity  Rectal: normal sphincter tone, no masses, no blood  Lower extremities: No edema or varicosities. Normal range of motion      No Known Allergies  Past Medical History  Diagnosis Date  . 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 Cesarean section      2 times   . Tubal ligation      Current Outpatient Prescriptions  Medication Sig Dispense Refill  . valACYclovir (VALTREX) 500 MG tablet Take 1 tablet (500 mg total) by mouth 2 (two) times daily.  6 tablet  6  . tinidazole (TINDAMAX) 500 MG tablet Take 4 tablets (2,000 mg total) by mouth daily. For 2 days.  8 tablet  0   No current facility-administered medications for this visit.    History   Social History  . Marital Status: Single    Spouse Name: N/A    Number of Children: N/A  . Years of Education: N/A   Occupational History  . Not on file.   Social History Main Topics  . Smoking status: Current Every Day Smoker  . Smokeless tobacco: Never Used  . Alcohol Use: No  . Drug Use: No  . Sexual Activity: Yes     Comment: BTL   Other Topics Concern  . Not on file   Social History Narrative  . No narrative on file    Family History  Problem Relation Age of Onset  . Diabetes Maternal Grandmother   . Cancer Maternal Grandfather    . Cancer Father   . Diabetes Mother       Jeannette CorpusCLARKE-PEARSON,Ola Raap L, MD 12/13/2013, 2:48 PM

## 2013-12-13 NOTE — Patient Instructions (Addendum)
Plan to follow up with Dr. Normand Sloopillard to arrange for removal of the ovarian cyst.  Please call for any questions or concerns.  Ovarian Cyst An ovarian cyst is a sac filled with fluid or blood. This sac is attached to the ovary. Some cysts go away on their own. Other cysts need treatment.  HOME CARE   Only take medicine as told by your doctor.  Follow up with your doctor as told.  Get regular pelvic exams and Pap tests. GET HELP IF:  Your periods are late, not regular, or painful.  You stop having periods.  Your belly (abdominal) or pelvic pain does not go away.  Your belly becomes large or puffy (swollen).  You have a hard time peeing (totally emptying your bladder).  You have pressure on your bladder.  You have pain during sex.  You feel fullness, pressure, or discomfort in your belly.  You lose weight for no reason.  You feel sick most of the time.  You have a hard time pooping (constipation).  You do not feel like eating.  You develop pimples (acne).  You have an increase in hair on your body and face.  You are gaining weight for no reason.  You think you are pregnant. GET HELP RIGHT AWAY IF:   Your belly pain gets worse.  You feel sick to your stomach (nauseous), and you throw up (vomit).  You have a fever that comes on fast.  You have belly pain while pooping (bowel movement).  Your periods are heavier than usual. MAKE SURE YOU:   Understand these instructions.  Will watch your condition.  Will get help right away if you are not doing well or get worse. Document Released: 03/03/2008 Document Revised: 07/06/2013 Document Reviewed: 05/23/2013 [facility] Patient Information 2014 [place], [place].

## 2014-01-26 ENCOUNTER — Other Ambulatory Visit: Payer: Self-pay | Admitting: Obstetrics and Gynecology

## 2014-01-28 ENCOUNTER — Emergency Department (HOSPITAL_COMMUNITY)
Admission: EM | Admit: 2014-01-28 | Discharge: 2014-01-28 | Disposition: A | Discharge status: Home / Self Care | Payer: BC Managed Care – PPO | Attending: Emergency Medicine | Admitting: Emergency Medicine

## 2014-01-28 ENCOUNTER — Ambulatory Visit (INDEPENDENT_AMBULATORY_CARE_PROVIDER_SITE_OTHER): Payer: BC Managed Care – PPO | Admitting: Physician Assistant

## 2014-01-28 ENCOUNTER — Emergency Department (HOSPITAL_COMMUNITY): Payer: BC Managed Care – PPO

## 2014-01-28 ENCOUNTER — Encounter (HOSPITAL_COMMUNITY): Payer: Self-pay | Admitting: Emergency Medicine

## 2014-01-28 VITALS — BP 132/76 | HR 160 | Temp 98.5°F | Resp 16

## 2014-01-28 DIAGNOSIS — Z8619 Personal history of other infectious and parasitic diseases: Secondary | ICD-10-CM | POA: Insufficient documentation

## 2014-01-28 DIAGNOSIS — F17200 Nicotine dependence, unspecified, uncomplicated: Secondary | ICD-10-CM | POA: Diagnosis not present

## 2014-01-28 DIAGNOSIS — Z79899 Other long term (current) drug therapy: Secondary | ICD-10-CM | POA: Insufficient documentation

## 2014-01-28 DIAGNOSIS — I4891 Unspecified atrial fibrillation: Secondary | ICD-10-CM

## 2014-01-28 DIAGNOSIS — R002 Palpitations: Secondary | ICD-10-CM | POA: Diagnosis present

## 2014-01-28 DIAGNOSIS — R42 Dizziness and giddiness: Secondary | ICD-10-CM | POA: Insufficient documentation

## 2014-01-28 DIAGNOSIS — R000 Tachycardia, unspecified: Secondary | ICD-10-CM

## 2014-01-28 LAB — BASIC METABOLIC PANEL
BUN: 6 mg/dL (ref 6–23)
CO2: 27 mEq/L (ref 19–32)
Calcium: 8.4 mg/dL (ref 8.4–10.5)
Chloride: 106 mEq/L (ref 96–112)
Creatinine, Ser: 0.53 mg/dL (ref 0.50–1.10)
GFR calc Af Amer: >90 mL/min (ref >90)
GFR calc non Af Amer: >90 mL/min (ref >90)
Glucose, Bld: 90 mg/dL (ref 70–99)
Potassium: 3.5 mEq/L — ABNORMAL LOW (ref 3.7–5.3)
Sodium: 141 mEq/L (ref 137–147)

## 2014-01-28 LAB — CBC WITH DIFFERENTIAL/PLATELET
Basophils Absolute: 0 10*3/uL (ref 0.0–0.1)
Basophils Relative: 0 % (ref 0–1)
Eosinophils Absolute: 0.2 10*3/uL (ref 0.0–0.7)
Eosinophils Relative: 2 % (ref 0–5)
HCT: 37.9 % (ref 36.0–46.0)
Hemoglobin: 12.8 g/dL (ref 12.0–15.0)
Lymphocytes Relative: 54 % — ABNORMAL HIGH (ref 12–46)
Lymphs Abs: 4.6 10*3/uL — ABNORMAL HIGH (ref 0.7–4.0)
MCH: 32.2 pg (ref 26.0–34.0)
MCHC: 33.8 g/dL (ref 30.0–36.0)
MCV: 95.5 fL (ref 78.0–100.0)
Monocytes Absolute: 0.5 10*3/uL (ref 0.1–1.0)
Monocytes Relative: 6 % (ref 3–12)
Neutro Abs: 3.3 10*3/uL (ref 1.7–7.7)
Neutrophils Relative %: 38 % — ABNORMAL LOW (ref 43–77)
Platelets: 261 10*3/uL (ref 150–400)
RBC: 3.97 MIL/uL (ref 3.87–5.11)
RDW: 12.9 % (ref 11.5–15.5)
WBC: 8.6 10*3/uL (ref 4.0–10.5)

## 2014-01-28 LAB — TSH: TSH: 1.11 u[IU]/mL (ref 0.350–4.500)

## 2014-01-28 LAB — I-STAT TROPONIN, ED: Troponin i, poc: 0.07 ng/mL (ref 0.00–0.08)

## 2014-01-28 MED ORDER — SODIUM CHLORIDE 0.9 % IV SOLN
1000.0000 mL | Freq: Once | INTRAVENOUS | Status: AC
  Administered 2014-01-28: 1000 mL via INTRAVENOUS

## 2014-01-28 MED ORDER — METOPROLOL SUCCINATE ER 25 MG PO TB24: 25.0000 mg | ORAL_TABLET | Freq: Every day | ORAL | Status: DC

## 2014-01-28 MED ORDER — SODIUM CHLORIDE 0.9 % IV SOLN: 1000.0000 mL | INTRAVENOUS | Status: DC

## 2014-01-28 MED ORDER — METOPROLOL SUCCINATE ER 25 MG PO TB24
25.0000 mg | ORAL_TABLET | Freq: Every day | ORAL | Status: DC
  Administered 2014-01-28: 25 mg via ORAL
  Filled 2014-01-28: qty 1

## 2014-01-28 NOTE — ED Notes (Signed)
Patient returned from xray.

## 2014-01-28 NOTE — ED Provider Notes (Signed)
CSN: [id number]     Arrival date & time 01/28/14  1814 History   First MD Initiated Contact with Patient 01/28/14 1838     Chief Complaint  Patient presents with  . nsr from af     Patient is a 38 y.o. female presenting with palpitations. The history is provided by the patient.  Palpitations Palpitations quality:  Irregular Duration:  10 hours Timing:  Constant Progression:  Resolved Chronicity:  New Context comment:  Pt felt it start after coughing this am at 0330. Relieved by:  Nothing Ineffective treatments:  None tried Associated symptoms: dizziness   Associated symptoms: no chest pain, no chest pressure, no shortness of breath and no syncope   Risk factors: no diabetes mellitus, no heart disease, no hx of atrial fibrillation, no hx of DVT, no hx of PE, no hx of thyroid disease and no hyperthyroidism   Pt went to an urgent care and had an EKG.  It showed a fib with RVR.  While there it spontaneously resolved.  She now feels fine.  Past Medical History  Diagnosis Date  . 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 Cesarean section      2 times   . Tubal ligation     Family History  Problem Relation Age of Onset  . Diabetes Maternal Grandmother   . Cancer Maternal Grandfather   . Cancer Father   . Diabetes Mother    History  Substance Use Topics  . Smoking status: Current Every Day Smoker -- 0.50 packs/day  . Smokeless tobacco: Never Used  . Alcohol Use: No   OB History   Grav Para Term Preterm Abortions TAB SAB Ect Mult Living   2 2 2       2      Review of Systems  Respiratory: Negative for shortness of breath.   Cardiovascular: Positive for palpitations. Negative for chest pain and syncope.  Neurological: Positive for dizziness.  All other systems reviewed and are negative.     Allergies  Review of patient's allergies indicates no known allergies.  Home Medications   Prior to  Admission medications   Medication Sig Start Date End Date Taking? Authorizing Provider  valACYclovir (VALTREX) 500 MG tablet Take 1 tablet (500 mg total) by mouth 2 (two) times daily. 10/11/12   Naima A Dillard, MD   BP 102/75  Pulse 89  Temp(Src) 97.7 F (36.5 C) (Oral)  Resp 22  SpO2 100%  LMP 01/26/2014 Physical Exam  Nursing note and vitals reviewed. Constitutional: She appears well-developed and well-nourished. No distress.  HENT:  Head: Normocephalic and atraumatic.  Right Ear: External ear normal.  Left Ear: External ear normal.  Eyes: Conjunctivae are normal. Right eye exhibits no discharge. Left eye exhibits no discharge. No scleral icterus.  Neck: Neck supple. No tracheal deviation present.  Cardiovascular: Normal rate, regular rhythm and intact distal pulses.   Pulmonary/Chest: Effort normal and breath sounds normal. No stridor. No respiratory distress. She has no wheezes. She has no rales.  Abdominal: Soft. Bowel sounds are normal. She exhibits no distension. There is no tenderness. There is no rebound and no guarding.  Musculoskeletal: She exhibits no edema and no tenderness.  Neurological: She is alert. She has normal strength. No cranial nerve deficit (no facial droop, extraocular movements intact, no slurred speech) or sensory deficit. She exhibits normal muscle tone. She displays no seizure activity. Coordination  normal.  Skin: Skin is warm and dry. No rash noted.  Psychiatric: She has a normal mood and affect.    ED Course  Procedures (including critical care time) Labs Review Labs Reviewed  CBC WITH DIFFERENTIAL - Abnormal; Notable for the following:    Neutrophils Relative % 38 (*)    Lymphocytes Relative 54 (*)    Lymphs Abs 4.6 (*)    All other components within normal limits  BASIC METABOLIC PANEL - Abnormal; Notable for the following:    Potassium 3.5 (*)    All other components within normal limits  TSH  T4, FREE  I-STAT TROPOININ, ED    Imaging  Review Dg Chest 2 View  01/28/2014   CLINICAL DATA:  Heart palpitations  EXAM: CHEST  2 VIEW  COMPARISON:  None.  FINDINGS: The heart size and mediastinal contours are within normal limits. Both lungs are clear. The visualized skeletal structures are unremarkable.  IMPRESSION: No active cardiopulmonary disease.   Electronically Signed   By: Amie Portlandavid  Ormond M.D.   On: 01/28/2014 21:06     EKG Interpretation   Date/Time:  Saturday Jan 28 2014 19:02:44 EDT Ventricular Rate:  92 PR Interval:  148 QRS Duration: 90 QT Interval:  441 QTC Calculation: 546 R Axis:   64 Text Interpretation:  Sinus rhythm Probable left atrial enlargement  Prolonged QT interval No old tracing to compare Confirmed by Emir Nack  MD-J,  Karene Bracken ([id number]) on 01/28/2014 7:12:45 PM     1856 Reviewed uc ekg.  A fib with RVR. At the bedside, monitor now shows nsr, rate 97 Cads 2 score 0.  Low risk for stroke.  2157  Pt had another brief episode in the ED.  ASymptomatc now.  No intervention requried MDM   Final diagnoses:  Atrial fibrillation with rapid ventricular response    Discussed with Dr Antoine Pochehochrein.  Will start pt on toprol xl 25 mg.  Follow up in the office next week.  Precautions discussed with patient.    Celene KrasJon R Demetress Tift, MD 01/28/14 2158

## 2014-01-28 NOTE — ED Notes (Signed)
From [facility] with af.  nsr now. Initially was af at [facility].  No complaints of chest pain nv or dizziness now,  2000  Last night had nausea dizziness lt shoulder pain.  None today

## 2014-01-28 NOTE — Discharge Instructions (Signed)
Atrial Fibrillation °Atrial fibrillation is a condition that causes your heart to beat irregularly. It may also cause your heart to beat faster than normal. Atrial fibrillation can prevent your heart from pumping blood normally. It increases your risk of stroke and heart problems. °HOME CARE °· Take medications as told by your doctor. °· Only take medications that your doctor says are safe. Some medications can make the condition worse or happen again. °· If blood thinners were prescribed by your doctor, take them exactly as told. Too much can cause bleeding. Too little and you will not have the needed protection against stroke and other problems. °· Perform blood tests at home if told by your doctor. °· Perform blood tests exactly as told by your doctor. °· Do not drink alcohol. °· Do not drink beverages with caffeine such as coffee, soda, and some teas. °· Maintain a healthy weight. °· Do not use diet pills unless your doctor says they are safe. They may make heart problems worse. °· Follow diet instructions as told by your doctor. °· Exercise regularly as told by your doctor. °· Keep all follow-up appointments. °GET HELP RIGHT AWAY IF:  °· You have chest or belly (abdominal) pain. °· You feel sick to your stomach (nauseous) °· You suddenly have swollen feet and ankles. °· You feel dizzy. °· You face, arms, or legs feel numb or weak. °· There is a change in your vision or speech. °· You notice a change in the speed, rhythm, or strength of your heartbeat. °· You suddenly begin peeing (urinating) more often. °· You get tired more easily when moving or exercising. °MAKE SURE YOU:  °· Understand these instructions. °· Will watch your condition. °· Will get help right away if you are not doing well or get worse. °Document Released: 06/24/2008 Document Revised: 01/10/2013 Document Reviewed: 10/26/2012 °ExitCare® Patient Information ©2014 [employer]. ° °

## 2014-01-28 NOTE — ED Notes (Signed)
Checked on pt and pt stated she is feeling a little weak.

## 2014-01-28 NOTE — ED Notes (Signed)
Phlebotomy notified of need for blood to be drawn.

## 2014-01-28 NOTE — ED Notes (Signed)
Patient gone to x-ray at this time. 

## 2014-01-28 NOTE — Progress Notes (Signed)
   Subjective:    Patient ID: Wendy Ramsey, female    DOB: 02-06-1976, 38 y.o.   MRN: [id number]  HPI Pt presents to clinic with feeling her heart faster than normal.  She thought it was the peppers that she ate for dinner but it has not gotten better since its onset last pm.  She started to feel a little flush and decided to be evaluated because it was not getting better.  She has never had anything like this before.  She smokes but has not recently use OTC cold preps or street drugs.  She is on no daily medications.  She does not believe she has been checked for thyroid problems recently.  She is having no CP, SOB, diaphoresis or nausea.  Currently she is not dizzy or feeling flushed.  Pt was brought out emergently due to her symptoms and emergently evaluated.  Review of Systems  HENT: Negative.   Cardiovascular: Positive for palpitations. Negative for chest pain.  Gastrointestinal: Negative for nausea.  Neurological: Negative for dizziness and light-headedness.       Objective:   Physical Exam  Vitals reviewed. Constitutional: She is oriented to person, place, and time. She appears well-developed and well-nourished.  HENT:  Head: Normocephalic and atraumatic.  Right Ear: External ear normal.  Left Ear: External ear normal.  Cardiovascular: Normal heart sounds.  An irregularly irregular rhythm present. Tachycardia present.   Pulse fluctuates from 141-172 on our heart monitor.  Pulmonary/Chest: Effort normal and breath sounds normal. She has no wheezes.  Neurological: She is alert and oriented to person, place, and time.  Skin: Skin is warm and dry.  Psychiatric: She has a normal mood and affect. Her behavior is normal. Judgment and thought content normal.   EKG - A fib    Assessment & Plan:  Palpitations - Plan: EKG 12-Lead  Tachycardia  Atrial fibrillation - new onset without chest pain  Due to new onset of atrial fib with normal BP at a young age to ED for evaluation  by cardiologist. [facility] She will need her thyroid evaluated at some point.  D/w Dr Dareen PianoAnderson - [facility] by [facility].  Benny LennertSarah Carsten Carstarphen PA-C  [facility] 01/28/2014 5:30 PM

## 2014-01-30 LAB — T4, FREE: Free T4: 1.09 ng/dL (ref 0.80–1.80)

## 2014-02-03 ENCOUNTER — Encounter: Payer: Self-pay | Admitting: Cardiovascular Disease

## 2014-02-03 ENCOUNTER — Ambulatory Visit (INDEPENDENT_AMBULATORY_CARE_PROVIDER_SITE_OTHER): Payer: BC Managed Care – PPO | Admitting: Cardiovascular Disease

## 2014-02-03 ENCOUNTER — Encounter (HOSPITAL_COMMUNITY): Payer: Self-pay | Admitting: Pharmacist

## 2014-02-03 VITALS — BP 102/60 | HR 76 | Ht 63.0 in | Wt 196.0 lb

## 2014-02-03 DIAGNOSIS — I480 Paroxysmal atrial fibrillation: Secondary | ICD-10-CM | POA: Insufficient documentation

## 2014-02-03 DIAGNOSIS — I4891 Unspecified atrial fibrillation: Secondary | ICD-10-CM

## 2014-02-03 NOTE — Assessment & Plan Note (Signed)
[facility] by Dr. Iantha FallenJohn Knapp for palpitations which ultimately was shown to be PAF which spontaneously converted to sinus rhythm. Her lab work was benign. She has no cardiac risk factors. Her CHADS score is low. At this point I do not think further workup is required. I will see her back when necessary.

## 2014-02-03 NOTE — Progress Notes (Signed)
     02/03/2014 Garnetta BuddyShekita C Calles   1976-06-20  [id number]  Primary Physician Geraldo PitterBLAND,VEITA J, MD Primary Cardiologist: Runell GessJonathan J. Nikolina Simerson MD Roseanne RenoFACP,FACC,FAHA, FSCAI   HPI:  Ms. Wendy Ramsey is a 38 year old moderately overweight single African American female mother of 2 children works as a Engineer, agriculturallaundry aide. Her primary care physician is Dr. Renaye RakersVeita Bland .she was referred by [facility] room where she saw Dr. Iantha FallenJohn Knapp for an episode of palpitations which was documented to be paroxysmal atrial fibrillation. Her only cardiac risk factor is tobacco abuse intensivist today. She otherwise denies chest pain or shortness of breath. The episode occurred after eating spicy food. She's never had an episode of PAF prior to this or subsequent to this event.   Current Outpatient Prescriptions  Medication Sig Dispense Refill  . ibuprofen (ADVIL,MOTRIN) 200 MG tablet Take 600 mg by mouth every 6 (six) hours as needed for moderate pain.      . metoprolol succinate (TOPROL-XL) 25 MG 24 hr tablet Take 1 tablet (25 mg total) by mouth daily.  30 tablet  0   No current facility-administered medications for this visit.    No Known Allergies  History   Social History  . Marital Status: Single    Spouse Name: N/A    Number of Children: N/A  . Years of Education: N/A   Occupational History  . Not on file.   Social History Main Topics  . Smoking status: Current Every Day Smoker -- 0.50 packs/day  . Smokeless tobacco: Never Used  . Alcohol Use: No  . Drug Use: No  . Sexual Activity: Yes     Comment: BTL   Other Topics Concern  . Not on file   Social History Narrative  . No narrative on file     Review of Systems: General: negative for chills, fever, night sweats or weight changes.  Cardiovascular: negative for chest pain, dyspnea on exertion, edema, orthopnea, palpitations, paroxysmal nocturnal dyspnea or shortness of breath Dermatological: negative for rash Respiratory: negative for cough  or wheezing Urologic: negative for hematuria Abdominal: negative for nausea, vomiting, diarrhea, bright red blood per rectum, melena, or hematemesis Neurologic: negative for visual changes, syncope, or dizziness All other systems reviewed and are otherwise negative except as noted above.    Blood pressure 102/60, pulse 76, height 5\' 3"  (1.6 m), weight 196 lb (88.905 kg), last menstrual period 01/26/2014.  General appearance: alert and no distress Neck: no adenopathy, no carotid bruit, no JVD, supple, symmetrical, trachea midline and thyroid not enlarged, symmetric, no tenderness/mass/nodules Lungs: clear to auscultation bilaterally Heart: regular rate and rhythm, S1, S2 normal, no murmur, click, rub or gallop Extremities: extremities normal, atraumatic, no cyanosis or edema  EKG normal sinus rhythm at 76 without ST or T wave changes  ASSESSMENT AND PLAN:   Paroxysmal atrial fibrillation Patient was recently [facility] in [facility] by Dr. Iantha FallenJohn Knapp for palpitations which ultimately was shown to be PAF which spontaneously converted to sinus rhythm. Her lab work was benign. She has no cardiac risk factors. Her CHADS score is low. At this point I do not think further workup is required. I will see her back when necessary.      Runell GessJonathan J. Elisavet Buehrer MD FACP,FACC,FAHA, [facility] LPFSCAI 02/03/2014 10:06 AM

## 2014-02-03 NOTE — Patient Instructions (Signed)
Dr Allyson SabalBerry has recommended that you follow-up with him only as needed.

## 2014-02-06 ENCOUNTER — Other Ambulatory Visit (HOSPITAL_COMMUNITY): Payer: Self-pay | Admitting: Obstetrics and Gynecology

## 2014-02-06 NOTE — H&P (Signed)
Wendy Ramsey is a 38 y.o.  female P 2-0-0-2 presents for laparoscopic removal of her left  ovary because of a complex left ovarian mass.  Over the past year the patient's menses became irregular in that her 6 day flow was punctuated by days in which she had no bleeding at all.  She goes on to report that the days that are "spotty" are accompanied by a foul odor in the vaginal area.  On her heaviest days,  she only changed her pad twice a day and denies any cramping.  She has no history of inter-menstrual bleeding, post-coital bleeding, dyspareunia or changes in bladder habits.  She admits, however, to constipation.  A pelvic ultrasound in March 2015 showed a uterus: 5.22 x 4.94 x 4.80 cm, endometrium-3.66 mm.; left ovary-4.62 x 3.97 x 3.00 cm, complex ovarian mass with irregular borders without color Doppler flow measuring 2.24 x 2.56 x 2.51 cm; right ovary-2.81 x 2.60 x 2.39 cm.  An endometrial biopsy done at the same time was benign and CA-125 = 4.3 unitls/mL.   She was seen by a GYN-Oncologist, Dr. De Blanchaniel Clarke-Pearson, who recommended a left salpingo-oophorectomy.  Given the findings on ultrasound and the recommendation from the oncologist the patient has decided to proceed with a left salpingo-oophorectomy  Past Medical History  OB History: G:2;  P 2-0-0-2   GYN History: menarche: 38 YO LMP: January 11, 2014;  Contracepton: Tubal Sterilzation; Denies STDs  or  history of abnormal PAP smear;  Last PAP smear: 11/2013  Medical History: Atrial Fibrillation (negative cardiac work-up with Dr. Allyson SabalBerry)  Surgical History: 2007  Tubal Sterilization   2008 Hysteroscopy, Dilatation, Curettage and Thermachoice Ablation Denies problems with anesthesia or history of blood transfusions  Family History: Diabetes Mellitus, Brain Cancer, Hypertension  Social History:  Single and employed as a Engineer, agriculturalLaundry Aide;  Admits to tobacco and occasional alcohol use  Medications:  Valacyclovir 500 mg  bid x 3 days  prn Metoprolol ER 25 mg  daily  Allergies:  Hydrocodone-itching         Latex-itching  Denies sensitivity to peanuts, shellfish, soy,  or adhesives.   ROS: Admits to constipation and a month ago-chest pain, shortness of breath with palpitations but a negative cardiac work up;   denies headache, vision changes, nasal congestion, dysphagia, tinnitus, dizziness, hoarseness, cough,  nausea, vomiting, diarrhea, urinary frequency, urgency  dysuria, hematuria, vaginitis symptoms, pelvic pain, swelling of joints,easy bruising,  myalgias, arthralgias, skin rashes, unexplained weight loss and except as is mentioned in the history of present illness, patient's review of systems is otherwise negative.   Physical Exam  Bp: 110/70   R: 14  P: 64   Temperature: 98.7 degrees F orally  Weight: 200 lbs.  Height: 5'2"  BMI: 36.6  Neck: supple without masses or thyromegaly Lungs: clear to auscultation Heart: regular rate and rhythm Abdomen: soft, non-tender and no organomegaly Pelvic:EGBUS- wnl; vagina-normal rugae; uterus-normal size, cervix without lesions or motion tenderness; adnexae-no tenderness or masses Extremities:  no clubbing, cyanosis or edema   Assesment:  Complex Left Ovarian Mass   Disposition:  A discussion was held with patient regarding the indication for her procedure(s) along with the risks, which include but are not limited to: reaction to anesthesia, damage to adjacent organs, infection and excessive bleeding. The patient verbalized understanding of these risks and has consented to proceed with a Laparoscopic Left Salpingo-oophorectomy, Possible Bilateral Salpingectomy, Possible Bilateral Ovarian Cystectomy and Possible [facility]  on Feb 14, 2014 at 1 p.m.     CSN# [id number]   Lind Ausley J. Lowell GuitarPowell, PA-C  for Dr. Pierre BaliNaima A. Dillard

## 2014-02-10 ENCOUNTER — Encounter (HOSPITAL_COMMUNITY)
Admission: RE | Admit: 2014-02-10 | Discharge: 2014-02-10 | Disposition: A | Discharge status: Home / Self Care | Payer: BC Managed Care – PPO | Source: Ambulatory Visit | Attending: Obstetrics and Gynecology | Admitting: Obstetrics and Gynecology

## 2014-02-10 ENCOUNTER — Encounter (HOSPITAL_COMMUNITY): Payer: Self-pay

## 2014-02-10 DIAGNOSIS — Z01812 Encounter for preprocedural laboratory examination: Secondary | ICD-10-CM | POA: Insufficient documentation

## 2014-02-10 HISTORY — DX: Cardiac arrhythmia, unspecified: I49.9

## 2014-02-10 LAB — CBC
HCT: 40.5 % (ref 36.0–46.0)
Hemoglobin: 13.9 g/dL (ref 12.0–15.0)
MCH: 32.8 pg (ref 26.0–34.0)
MCHC: 34.3 g/dL (ref 30.0–36.0)
MCV: 95.5 fL (ref 78.0–100.0)
Platelets: 297 10*3/uL (ref 150–400)
RBC: 4.24 MIL/uL (ref 3.87–5.11)
RDW: 12.9 % (ref 11.5–15.5)
WBC: 7 10*3/uL (ref 4.0–10.5)

## 2014-02-10 NOTE — Patient Instructions (Signed)
20 Garnetta BuddyShekita C Kroenke  02/10/2014   Your procedure is scheduled on:  02/14/14  Enter through the Main Entrance of [facility] at 1130 AM.  Pick up the phone at the desk and dial [phone].   Call this number if you have problems the morning of surgery: [phone]   Remember:   Do not eat food:After Midnight.  Do not drink clear liquids: 4 Hours before arrival.  Take these medicines the morning of surgery with A SIP OF WATER: NA   Do not wear jewelry, make-up or nail polish.  Do not wear lotions, powders, or perfumes. You may wear deodorant.  Do not shave 48 hours prior to surgery.  Do not bring valuables to the hospital.  [facility] is not   responsible for any belongings or valuables brought to the hospital.  Contacts, dentures or bridgework may not be worn into surgery.  Leave suitcase in the car. After surgery it may be brought to your room.  For patients admitted to the hospital, checkout time is 11:00 AM the day of              discharge.   Patients discharged the day of surgery will not be allowed to drive             home.  Name and phone number of your driver: NA  Special Instructions:      Please read over the following fact sheets that you were given:   Surgical Site Infection Prevention

## 2014-02-14 ENCOUNTER — Ambulatory Visit (HOSPITAL_COMMUNITY): Payer: BC Managed Care – PPO | Admitting: Anesthesiology

## 2014-02-14 ENCOUNTER — Encounter (HOSPITAL_COMMUNITY): Payer: Self-pay

## 2014-02-14 ENCOUNTER — Encounter (HOSPITAL_COMMUNITY): Payer: BC Managed Care – PPO | Admitting: Anesthesiology

## 2014-02-14 ENCOUNTER — Ambulatory Visit (HOSPITAL_COMMUNITY)
Admission: RE | Admit: 2014-02-14 | Discharge: 2014-02-15 | Disposition: A | Discharge status: Home / Self Care | Payer: BC Managed Care – PPO | Source: Ambulatory Visit | Attending: Obstetrics and Gynecology | Admitting: Obstetrics and Gynecology

## 2014-02-14 ENCOUNTER — Encounter (HOSPITAL_COMMUNITY)
Admission: RE | Disposition: A | Discharge status: Home / Self Care | Payer: Self-pay | Source: Ambulatory Visit | Attending: Obstetrics and Gynecology

## 2014-02-14 DIAGNOSIS — R1032 Left lower quadrant pain: Secondary | ICD-10-CM

## 2014-02-14 DIAGNOSIS — F17200 Nicotine dependence, unspecified, uncomplicated: Secondary | ICD-10-CM | POA: Diagnosis not present

## 2014-02-14 DIAGNOSIS — Y9210 Unspecified residential institution as the place of occurrence of the external cause: Secondary | ICD-10-CM | POA: Diagnosis not present

## 2014-02-14 DIAGNOSIS — N83209 Unspecified ovarian cyst, unspecified side: Secondary | ICD-10-CM | POA: Diagnosis present

## 2014-02-14 DIAGNOSIS — Z6835 Body mass index (BMI) 35.0-35.9, adult: Secondary | ICD-10-CM | POA: Diagnosis not present

## 2014-02-14 DIAGNOSIS — I4891 Unspecified atrial fibrillation: Secondary | ICD-10-CM | POA: Insufficient documentation

## 2014-02-14 DIAGNOSIS — IMO0002 Reserved for concepts with insufficient information to code with codable children: Secondary | ICD-10-CM | POA: Insufficient documentation

## 2014-02-14 DIAGNOSIS — N838 Other noninflammatory disorders of ovary, fallopian tube and broad ligament: Secondary | ICD-10-CM | POA: Diagnosis not present

## 2014-02-14 DIAGNOSIS — Z9889 Other specified postprocedural states: Secondary | ICD-10-CM

## 2014-02-14 DIAGNOSIS — R000 Tachycardia, unspecified: Secondary | ICD-10-CM | POA: Insufficient documentation

## 2014-02-14 HISTORY — PX: LAPAROSCOPIC BILATERAL SALPINGECTOMY: SHX5889

## 2014-02-14 HISTORY — PX: LAPAROSCOPY: SHX197

## 2014-02-14 HISTORY — PX: LAPAROSCOPIC LYSIS OF ADHESIONS: SHX5905

## 2014-02-14 LAB — MRSA PCR SCREENING: MRSA by PCR: NEGATIVE

## 2014-02-14 LAB — PREGNANCY, URINE: Preg Test, Ur: NEGATIVE

## 2014-02-14 SURGERY — LAPAROSCOPY OPERATIVE
Anesthesia: General | Site: Abdomen

## 2014-02-14 MED ORDER — KETOROLAC TROMETHAMINE 30 MG/ML IJ SOLN
INTRAMUSCULAR | Status: DC | PRN
  Administered 2014-02-14: 30 mg via INTRAVENOUS

## 2014-02-14 MED ORDER — LACTATED RINGERS IV SOLN
INTRAVENOUS | Status: DC
  Administered 2014-02-14: 500 mL/h via INTRAVENOUS
  Administered 2014-02-14 (×3): via INTRAVENOUS

## 2014-02-14 MED ORDER — LIDOCAINE HCL (CARDIAC) 20 MG/ML IV SOLN
INTRAVENOUS | Status: AC
  Filled 2014-02-14: qty 5

## 2014-02-14 MED ORDER — MEPERIDINE HCL 25 MG/ML IJ SOLN
INTRAMUSCULAR | Status: AC
  Administered 2014-02-14: 12.5 mg via INTRAVENOUS
  Filled 2014-02-14: qty 1

## 2014-02-14 MED ORDER — ROCURONIUM BROMIDE 100 MG/10ML IV SOLN
INTRAVENOUS | Status: DC | PRN
  Administered 2014-02-14: 40 mg via INTRAVENOUS
  Administered 2014-02-14: 10 mg via INTRAVENOUS

## 2014-02-14 MED ORDER — FENTANYL CITRATE 0.05 MG/ML IJ SOLN
INTRAMUSCULAR | Status: DC | PRN
  Administered 2014-02-14 (×2): 100 ug via INTRAVENOUS
  Administered 2014-02-14: 50 ug via INTRAVENOUS

## 2014-02-14 MED ORDER — PROPOFOL 10 MG/ML IV BOLUS
INTRAVENOUS | Status: DC | PRN
  Administered 2014-02-14: 200 mg via INTRAVENOUS

## 2014-02-14 MED ORDER — MEPERIDINE HCL 25 MG/ML IJ SOLN
6.2500 mg | INTRAMUSCULAR | Status: DC | PRN
  Administered 2014-02-14: 12.5 mg via INTRAVENOUS

## 2014-02-14 MED ORDER — IBUPROFEN 200 MG PO TABS: ORAL_TABLET | ORAL | Status: DC

## 2014-02-14 MED ORDER — HYDROMORPHONE HCL PF 1 MG/ML IJ SOLN
INTRAMUSCULAR | Status: AC
  Filled 2014-02-14: qty 1

## 2014-02-14 MED ORDER — HYDROCODONE-ACETAMINOPHEN 5-325 MG PO TABS: 1.0000 | ORAL_TABLET | ORAL | Status: DC | PRN

## 2014-02-14 MED ORDER — KETOROLAC TROMETHAMINE 30 MG/ML IJ SOLN: 15.0000 mg | Freq: Once | INTRAMUSCULAR | Status: DC | PRN

## 2014-02-14 MED ORDER — ONDANSETRON HCL 4 MG/2ML IJ SOLN
INTRAMUSCULAR | Status: AC
  Filled 2014-02-14: qty 2

## 2014-02-14 MED ORDER — PHENYLEPHRINE 40 MCG/ML (10ML) SYRINGE FOR IV PUSH (FOR BLOOD PRESSURE SUPPORT)
PREFILLED_SYRINGE | INTRAVENOUS | Status: AC
  Filled 2014-02-14: qty 5

## 2014-02-14 MED ORDER — IBUPROFEN 600 MG PO TABS
600.0000 mg | ORAL_TABLET | Freq: Four times a day (QID) | ORAL | Status: DC | PRN
  Administered 2014-02-15 (×2): 600 mg via ORAL
  Filled 2014-02-14 (×2): qty 1

## 2014-02-14 MED ORDER — PROMETHAZINE HCL 25 MG/ML IJ SOLN: 6.2500 mg | INTRAMUSCULAR | Status: DC | PRN

## 2014-02-14 MED ORDER — HEPARIN SODIUM (PORCINE) 5000 UNIT/ML IJ SOLN
INTRAMUSCULAR | Status: AC
Start: 2014-02-14 — End: 2014-02-14
  Filled 2014-02-14: qty 1

## 2014-02-14 MED ORDER — ROCURONIUM BROMIDE 100 MG/10ML IV SOLN
INTRAVENOUS | Status: AC
  Filled 2014-02-14: qty 1

## 2014-02-14 MED ORDER — MIDAZOLAM HCL 2 MG/2ML IJ SOLN
INTRAMUSCULAR | Status: DC | PRN
  Administered 2014-02-14: 2 mg via INTRAVENOUS

## 2014-02-14 MED ORDER — GLYCOPYRROLATE 0.2 MG/ML IJ SOLN
INTRAMUSCULAR | Status: DC | PRN
  Administered 2014-02-14: 0.1 mg via INTRAVENOUS

## 2014-02-14 MED ORDER — BUPIVACAINE-EPINEPHRINE 0.25% -1:200000 IJ SOLN
INTRAMUSCULAR | Status: DC | PRN
  Administered 2014-02-14: 19 mL

## 2014-02-14 MED ORDER — DEXAMETHASONE SODIUM PHOSPHATE 10 MG/ML IJ SOLN
INTRAMUSCULAR | Status: DC | PRN
  Administered 2014-02-14: 10 mg via INTRAVENOUS

## 2014-02-14 MED ORDER — DEXAMETHASONE SODIUM PHOSPHATE 10 MG/ML IJ SOLN
INTRAMUSCULAR | Status: AC
  Filled 2014-02-14: qty 1

## 2014-02-14 MED ORDER — MIDAZOLAM HCL 2 MG/2ML IJ SOLN: 0.5000 mg | Freq: Once | INTRAMUSCULAR | Status: DC | PRN

## 2014-02-14 MED ORDER — PROPOFOL 10 MG/ML IV EMUL
INTRAVENOUS | Status: AC
  Filled 2014-02-14: qty 20

## 2014-02-14 MED ORDER — KETOROLAC TROMETHAMINE 30 MG/ML IJ SOLN
INTRAMUSCULAR | Status: AC
  Filled 2014-02-14: qty 1

## 2014-02-14 MED ORDER — PHENYLEPHRINE HCL 10 MG/ML IJ SOLN
INTRAMUSCULAR | Status: DC | PRN
  Administered 2014-02-14 (×2): 80 ug via INTRAVENOUS

## 2014-02-14 MED ORDER — LORAZEPAM 2 MG/ML IJ SOLN
INTRAMUSCULAR | Status: AC
  Administered 2014-02-14: 1 mg
  Filled 2014-02-14: qty 1

## 2014-02-14 MED ORDER — BUPIVACAINE-EPINEPHRINE (PF) 0.25% -1:200000 IJ SOLN
INTRAMUSCULAR | Status: AC
  Filled 2014-02-14: qty 30

## 2014-02-14 MED ORDER — MIDAZOLAM HCL 2 MG/2ML IJ SOLN
INTRAMUSCULAR | Status: AC
  Filled 2014-02-14: qty 2

## 2014-02-14 MED ORDER — HYDROCODONE-ACETAMINOPHEN 5-325 MG PO TABS: ORAL_TABLET | ORAL | Status: DC

## 2014-02-14 MED ORDER — GLYCOPYRROLATE 0.2 MG/ML IJ SOLN
INTRAMUSCULAR | Status: AC
  Filled 2014-02-14: qty 3

## 2014-02-14 MED ORDER — ONDANSETRON HCL 4 MG/2ML IJ SOLN
INTRAMUSCULAR | Status: DC | PRN
  Administered 2014-02-14: 4 mg via INTRAVENOUS

## 2014-02-14 MED ORDER — LACTATED RINGERS IV SOLN
INTRAVENOUS | Status: DC
  Administered 2014-02-15: 04:00:00 via INTRAVENOUS

## 2014-02-14 MED ORDER — METHYLENE BLUE 1 % INJ SOLN
INTRAMUSCULAR | Status: AC
  Filled 2014-02-14: qty 10

## 2014-02-14 MED ORDER — NEOSTIGMINE METHYLSULFATE 10 MG/10ML IV SOLN
INTRAVENOUS | Status: AC
  Filled 2014-02-14: qty 1

## 2014-02-14 MED ORDER — GLYCOPYRROLATE 0.2 MG/ML IJ SOLN
INTRAMUSCULAR | Status: AC
  Filled 2014-02-14: qty 1

## 2014-02-14 MED ORDER — HYDROMORPHONE HCL PF 1 MG/ML IJ SOLN
INTRAMUSCULAR | Status: AC
  Administered 2014-02-14: 0.5 mg via INTRAVENOUS
  Filled 2014-02-14: qty 1

## 2014-02-14 MED ORDER — LIDOCAINE HCL (CARDIAC) 20 MG/ML IV SOLN
INTRAVENOUS | Status: DC | PRN
  Administered 2014-02-14: 50 mg via INTRAVENOUS

## 2014-02-14 MED ORDER — FENTANYL CITRATE 0.05 MG/ML IJ SOLN
INTRAMUSCULAR | Status: AC
  Filled 2014-02-14: qty 5

## 2014-02-14 MED ORDER — MIDAZOLAM HCL 2 MG/2ML IJ SOLN: 1.0000 mg | Freq: Once | INTRAMUSCULAR | Status: DC

## 2014-02-14 MED ORDER — HYDROMORPHONE HCL PF 1 MG/ML IJ SOLN
0.2500 mg | INTRAMUSCULAR | Status: DC | PRN
  Administered 2014-02-14 (×3): 0.5 mg via INTRAVENOUS

## 2014-02-14 SURGICAL SUPPLY — 54 items
ADH SKN CLS APL DERMABOND .7 (GAUZE/BANDAGES/DRESSINGS) ×3
BAG SPEC RTRVL LRG 6X4 10 (ENDOMECHANICALS)
BARRIER ADHS 3X4 INTERCEED (GAUZE/BANDAGES/DRESSINGS) IMPLANT
BRR ADH 4X3 ABS CNTRL BYND (GAUZE/BANDAGES/DRESSINGS)
CABLE HIGH FREQUENCY MONO STRZ (ELECTRODE) IMPLANT
CANISTER SUCT 3000ML (MISCELLANEOUS) ×4 IMPLANT
CHLORAPREP W/TINT 26ML (MISCELLANEOUS) ×4 IMPLANT
CLOTH BEACON ORANGE TIMEOUT ST (SAFETY) ×4 IMPLANT
CONTAINER PREFILL 10% NBF 15ML (MISCELLANEOUS) IMPLANT
DECANTER SPIKE VIAL GLASS SM (MISCELLANEOUS) IMPLANT
DERMABOND ADVANCED (GAUZE/BANDAGES/DRESSINGS) ×1
DERMABOND ADVANCED .7 DNX12 (GAUZE/BANDAGES/DRESSINGS) ×3 IMPLANT
DILATOR CANAL MILEX (MISCELLANEOUS) ×2 IMPLANT
DRAPE WARM FLUID 44X44 (DRAPE) IMPLANT
DRSG VASELINE 3X18 (GAUZE/BANDAGES/DRESSINGS) IMPLANT
ELECT CAUTERY BLADE 6.4 (BLADE) IMPLANT
FORCEPS CUTTING 33CM 5MM (CUTTING FORCEPS) ×2 IMPLANT
FORCEPS CUTTING 45CM 5MM (CUTTING FORCEPS) IMPLANT
GAUZE SPONGE 4X4 16PLY XRAY LF (GAUZE/BANDAGES/DRESSINGS) ×4 IMPLANT
GLOVE BIO SURGEON STRL SZ 6.5 (GLOVE) ×4 IMPLANT
GLOVE BIOGEL PI IND STRL 7.0 (GLOVE) ×6 IMPLANT
GLOVE BIOGEL PI INDICATOR 7.0 (GLOVE) ×2
GOWN STRL REUS W/TWL LRG LVL3 (GOWN DISPOSABLE) ×8 IMPLANT
MANIPULATOR UTERINE 4.5 ZUMI (MISCELLANEOUS) ×2 IMPLANT
NDL HYPO 25X1 1.5 SAFETY (NEEDLE) IMPLANT
NEEDLE HYPO 25X1 1.5 SAFETY (NEEDLE) IMPLANT
NS IRRIG 1000ML POUR BTL (IV SOLUTION) ×4 IMPLANT
PACK ABDOMINAL GYN (CUSTOM PROCEDURE TRAY) ×4 IMPLANT
PACK LAPAROSCOPY BASIN (CUSTOM PROCEDURE TRAY) ×4 IMPLANT
PAD OB MATERNITY 4.3X12.25 (PERSONAL CARE ITEMS) ×4 IMPLANT
POUCH SPECIMEN RETRIEVAL 10MM (ENDOMECHANICALS) IMPLANT
PROTECTOR NERVE ULNAR (MISCELLANEOUS) ×4 IMPLANT
SET IRRIG TUBING LAPAROSCOPIC (IRRIGATION / IRRIGATOR) IMPLANT
SHEARS HARMONIC ACE PLUS 36CM (ENDOMECHANICALS) IMPLANT
SOLUTION ELECTROLUBE (MISCELLANEOUS) IMPLANT
SPONGE LAP 18X18 X RAY DECT (DISPOSABLE) ×8 IMPLANT
STAPLER VISISTAT 35W (STAPLE) ×4 IMPLANT
SUT CHROMIC 2 0 CT 1 (SUTURE) ×4 IMPLANT
SUT MNCRL AB 3-0 PS2 27 (SUTURE) ×4 IMPLANT
SUT PDS AB 0 CT 36 (SUTURE) IMPLANT
SUT PLAIN 2 0 XLH (SUTURE) IMPLANT
SUT VIC AB 0 CT1 27 (SUTURE) ×8
SUT VIC AB 0 CT1 27XBRD ANBCTR (SUTURE) ×6 IMPLANT
SUT VICRYL 0 ENDOLOOP (SUTURE) IMPLANT
SUT VICRYL 0 TIES 12 18 (SUTURE) ×4 IMPLANT
SUT VICRYL 0 UR6 27IN ABS (SUTURE) ×8 IMPLANT
SYR 50ML LL SCALE MARK (SYRINGE) IMPLANT
SYR CONTROL 10ML LL (SYRINGE) IMPLANT
TOWEL OR 17X24 6PK STRL BLUE (TOWEL DISPOSABLE) ×8 IMPLANT
TRAY FOLEY CATH 14FR (SET/KITS/TRAYS/PACK) ×4 IMPLANT
TROCAR BALLN 12MMX100 BLUNT (TROCAR) ×4 IMPLANT
TROCAR XCEL NON-BLD 5MMX100MML (ENDOMECHANICALS) ×8 IMPLANT
WARMER LAPAROSCOPE (MISCELLANEOUS) ×4 IMPLANT
WATER STERILE IRR 1000ML POUR (IV SOLUTION) ×4 IMPLANT

## 2014-02-14 NOTE — Anesthesia Postprocedure Evaluation (Signed)
  Anesthesia Post Note  Patient: Wendy Ramsey  Procedure(s) Performed: Procedure(s) (LRB): LAPAROSCOPY OPERATIVE  (N/A) LAPAROSCOPIC BILATERAL SALPINGECTOMY with cervical laceration repair (Bilateral) LAPAROSCOPIC LYSIS OF ADHESIONS (N/A)  Anesthesia type: GA  Patient location: PACU  Post pain: Pain level controlled  Post assessment: Post-op Vital signs reviewed  Last Vitals:  Filed Vitals:   02/14/14 1800  BP: 105/66  Pulse: 90  Temp:   Resp: 19    Post vital signs: Reviewed  Level of consciousness: sedated  Complications: No apparent anesthesia complications. Transient tachycardia that resolved on its own without treatment (may have been paroxysmal a fib).  Will send to tele

## 2014-02-14 NOTE — Anesthesia Preprocedure Evaluation (Signed)
Anesthesia Evaluation  Patient identified by MRN, date of birth, ID band Patient awake    Reviewed: Allergy & Precautions, H&P , Patient's Chart, lab work & pertinent test results, reviewed documented beta blocker date and time   History of Anesthesia Complications Negative for: history of anesthetic complications  Airway Mallampati: III TM Distance: >3 FB Neck ROM: full    Dental   Pulmonary Current Smoker,  breath sounds clear to auscultation        Cardiovascular Exercise Tolerance: Good + dysrhythmias Atrial Fibrillation Rhythm:regular Rate:Normal     Neuro/Psych    GI/Hepatic   Endo/Other  Morbid obesity  Renal/GU      Musculoskeletal   Abdominal   Peds  Hematology   Anesthesia Other Findings   Reproductive/Obstetrics                           Anesthesia Physical Anesthesia Plan  ASA: III  Anesthesia Plan: General ETT   Post-op Pain Management:    Induction:   Airway Management Planned:   Additional Equipment:   Intra-op Plan:   Post-operative Plan:   Informed Consent: I have reviewed the patients History and Physical, chart, labs and discussed the procedure including the risks, benefits and alternatives for the proposed anesthesia with the patient or authorized representative who has indicated his/her understanding and acceptance.   Dental Advisory Given  Plan Discussed with: CRNA and Surgeon  Anesthesia Plan Comments:         Anesthesia Quick Evaluation

## 2014-02-14 NOTE — Interval H&P Note (Signed)
History and Physical Interval Note:  02/14/2014 12:45 PM  Wendy Ramsey  has presented today for surgery, with the diagnosis of Ovarian Mass  The various methods of treatment have been discussed with the patient and family. After consideration of risks, benefits and other options for treatment, the patient has consented to  Procedure(s): LAPAROSCOPY OPERATIVE WITH LEFT SALPINO OOPHORETOMY WITH POSSIBLE BILATERAL SALPINGECTOMY WITH POSSIBLE OVARIAN CYSTECTOMY (N/A) EXPLORATORY LAPAROTOMY (N/A) as a surgical intervention .  The patient's history has been reviewed, patient examined, no change in status, stable for surgery.  I have reviewed the patient's chart and labs.  Questions were answered to the patient's satisfaction.     [name]

## 2014-02-14 NOTE — Discharge Instructions (Signed)
Call [facility] @ [phone] if:  You have a temperature greater than or equal to 100.4 degrees Farenheit orally You have pain that is not made better by the pain medication given and taken as directed    Take Colace (Docusate Sodium/Stool Softener) 100 mg 2-3 times daily while taking narcotic pain medicine to avoid constipation or until bowel movements are regular.  You may drive after 24 hours  You may shower tomorrow You may resume a regular diet  Keep incisions clean and dry Avoid anything in vagina until after your post-operative visit  Keep follow up appointment with Dr.Dillard on March 06, 2014 at 4:15 p.m. DISCHARGE INSTRUCTIONS: Laparoscopy  The following instructions have been prepared to help you care for yourself upon your return home today.  Wound care:  Do not get the incision wet for the first 24 hours. The incision should be kept clean and dry.  The Band-Aids or dressings may be removed the day after surgery.  Should the incision become sore, red, and swollen after the first week, check with your doctor.  Personal hygiene:  Shower the day after your procedure.  Activity and limitations:  Do NOT drive or operate any equipment today.  Do NOT lift anything more than 15 pounds for 2-3 weeks after surgery.  Do NOT rest in bed all day.  Walking is encouraged. Walk each day, starting slowly with 5-minute walks 3 or 4 times a day. Slowly increase the length of your walks.  Walk up and down stairs slowly.  Do NOT do strenuous activities, such as golfing, playing tennis, bowling, running, biking, weight lifting, gardening, mowing, or vacuuming for 2-4 weeks. Ask your doctor when it is okay to start.  Diet: Eat a light meal as desired this evening. You may resume your usual diet tomorrow.  Return to work: This is dependent on the type of work you do. For the most part you can return to a desk job within a week of surgery. If you are more active at  work, please discuss this with your doctor.  What to expect after your surgery: You may have a slight burning sensation when you urinate on the first day. You may have a very small amount of blood in the urine. Expect to have a small amount of vaginal discharge/light bleeding for 1-2 weeks. It is not unusual to have abdominal soreness and bruising for up to 2 weeks. You may be tired and need more rest for about 1 week. You may experience shoulder pain for 24-72 hours. Lying flat in bed may relieve it.  Call your doctor for any of the following:  Develop a fever of 100.4 or greater  Inability to urinate 6 hours after discharge from hospital  Severe pain not relieved by pain medications  Persistent of heavy bleeding at incision site  Redness or swelling around incision site after a week  Increasing nausea or vomiting  Patient Signature________________________________________ Nurse Signature_________________________________________

## 2014-02-14 NOTE — Transfer of Care (Signed)
Immediate Anesthesia Transfer of Care Note  Patient: Wendy Ramsey  Procedure(s) Performed: Procedure(s): LAPAROSCOPY OPERATIVE  (N/A) LAPAROSCOPIC BILATERAL SALPINGECTOMY with cervical laceration repair (Bilateral) LAPAROSCOPIC LYSIS OF ADHESIONS (N/A)  Patient Location: PACU  Anesthesia Type:General  Level of Consciousness: awake  Airway & Oxygen Therapy: Patient Spontanous Breathing  Post-op Assessment: Report given to PACU RN  Post vital signs: stable  Filed Vitals:   02/14/14 1108  BP: 107/68  Pulse: 84  Temp: 36.9 C  Resp: 20    Complications: No apparent anesthesia complications

## 2014-02-14 NOTE — H&P (View-Only) (Signed)
Wendy Ramsey is a 38 y.o.  female P 2-0-0-2 presents for laparoscopic removal of her left  ovary because of a complex left ovarian mass.  Over the past year the patient's menses became irregular in that her 6 day flow was punctuated by days in which she had no bleeding at all.  She goes on to report that the days that are "spotty" are accompanied by a foul odor in the vaginal area.  On her heaviest days,  she only changed her pad twice a day and denies any cramping.  She has no history of inter-menstrual bleeding, post-coital bleeding, dyspareunia or changes in bladder habits.  She admits, however, to constipation.  A pelvic ultrasound in March 2015 showed a uterus: 5.22 x 4.94 x 4.80 cm, endometrium-3.66 mm.; left ovary-4.62 x 3.97 x 3.00 cm, complex ovarian mass with irregular borders without color Doppler flow measuring 2.24 x 2.56 x 2.51 cm; right ovary-2.81 x 2.60 x 2.39 cm.  An endometrial biopsy done at the same time was benign and CA-125 = 4.3 unitls/mL.   She was seen by a GYN-Oncologist, Dr. De Blanchaniel Clarke-Pearson, who recommended a left salpingo-oophorectomy.  Given the findings on ultrasound and the recommendation from the oncologist the patient has decided to proceed with a left salpingo-oophorectomy  Past Medical History  OB History: G:2;  P 2-0-0-2   GYN History: menarche: 38 YO LMP: January 11, 2014;  Contracepton: Tubal Sterilzation; Denies STDs  or  history of abnormal PAP smear;  Last PAP smear: 11/2013  Medical History: Atrial Fibrillation (negative cardiac work-up with Dr. Allyson SabalBerry)  Surgical History: 2007  Tubal Sterilization   2008 Hysteroscopy, Dilatation, Curettage and Thermachoice Ablation Denies problems with anesthesia or history of blood transfusions  Family History: Diabetes Mellitus, Brain Cancer, Hypertension  Social History:  Single and employed as a Engineer, agriculturalLaundry Aide;  Admits to tobacco and occasional alcohol use  Medications:  Valacyclovir 500 mg  bid x 3 days  prn Metoprolol ER 25 mg  daily  Allergies:  Hydrocodone-itching         Latex-itching  Denies sensitivity to peanuts, shellfish, soy,  or adhesives.   ROS: Admits to constipation and a month ago-chest pain, shortness of breath with palpitations but a negative cardiac work up;   denies headache, vision changes, nasal congestion, dysphagia, tinnitus, dizziness, hoarseness, cough,  nausea, vomiting, diarrhea, urinary frequency, urgency  dysuria, hematuria, vaginitis symptoms, pelvic pain, swelling of joints,easy bruising,  myalgias, arthralgias, skin rashes, unexplained weight loss and except as is mentioned in the history of present illness, patient's review of systems is otherwise negative.   Physical Exam  Bp: 110/70   R: 14  P: 64   Temperature: 98.7 degrees F orally  Weight: 200 lbs.  Height: 5'2"  BMI: 36.6  Neck: supple without masses or thyromegaly Lungs: clear to auscultation Heart: regular rate and rhythm Abdomen: soft, non-tender and no organomegaly Pelvic:EGBUS- wnl; vagina-normal rugae; uterus-normal size, cervix without lesions or motion tenderness; adnexae-no tenderness or masses Extremities:  no clubbing, cyanosis or edema   Assesment:  Complex Left Ovarian Mass   Disposition:  A discussion was held with patient regarding the indication for her procedure(s) along with the risks, which include but are not limited to: reaction to anesthesia, damage to adjacent organs, infection and excessive bleeding. The patient verbalized understanding of these risks and has consented to proceed with a Laparoscopic Left Salpingo-oophorectomy, Possible Bilateral Salpingectomy, Possible Bilateral Ovarian Cystectomy and Possible Exploratory Laparotomy at [facility]  on Feb 14, 2014 at 1 p.m.     CSN# [id number]   Niguel Moure J. Mirayah Wren, PA-C  for Dr. Naima A. Dillard   

## 2014-02-14 NOTE — Progress Notes (Signed)
Pt transferred to AICU, upon arrival to the unit pt became very anxious and tearful.  Heart rate up to 170's, radial pulse difficult to palpate, auscultation revealed rapid heart rate, vss and O2 sats remained wnl.  Reassured pt. Dr. Malen GauzeFoster called and made aware, heart rate back down in 110's quickly, order received for iv versed and ekg.  Heart rate back down in the 90's prior to administration of versed.  Pt. states her surgical pain is a 2.  She verbalizes her concern over who will take care of her boys if something happens to her.  Much calmer after versed administration and able to get up to void and back to bed with assistance. Will continue to monitor and manage.

## 2014-02-14 NOTE — Op Note (Signed)
   Pre-operative Diagnosis: ovarian cyst  Post-operative Diagnosis: right paratubal cyst, pelvic adhesions.    Surgeon: ZOXWRUE,AVWUJILLARD,Lorey Pallett A   Assistants: Henreitta LeberElmira Powell PA  Anesthesia: General endotracheal anesthesia   Procedure : Laparoscopic Bilateral Salpingectomy,LOA, repair of cervical laceration  Procedure Details  The patient was seen in the Holding Room. The risks, benefits, complications, treatment options, and expected outcomes were discussed with the patient. The possibilities of reaction to medication, pulmonary aspiration, perforation of viscus, bleeding, recurrent infection, the need for additional procedures, failure to diagnose a condition, and creating a complication requiring transfusion or operation were discussed with the patient. The patient concurred with the proposed plan, giving informed consent. . A Time Out was held and the above information confirmed.  After induction of general anesthesia, the patient was placed in modified dorsal lithotomy position where she was prepped, draped, and catheterized in the normal, sterile fashion. A foley catheter was placed..  The cervix was visualized and an intrauterine manipulator was placed. A 2 cm umbilical incision was then performed.and carried down to the fascia.  The fascia was then opened and extended bilaterally.  Peritoneum was then entered.  o vicryl was then placed around the fascia in a circumferential fashion.   The hasson was placed and ancored to the suture.. Normal pelvic anatomy was noted.  The ovarian cyst had resolved.  Pt had para tubal cysts.  An omental adhesion to the anterior abdominal wall and her righ fallopian tube was adherant to the abdominal wall.   .  The tubes, ovaries and appendix apperared normal.   The anterior and  Posterior culdesac and liver appeared normal.     Two 5mm trocars were placed in the right and left lower quadrants of the abdomen under direct visualization of the laparoscope. The omental  adhesion and right tubal adhesion was lysed.   The tubes were removed and sent to pathology using the gyrus.  Hemostasis was noted.  Air was allowed to leave the abdomen.  The abdomen was reinsufflated and hemostasis was still noted.     Following the procedure the umbilical hasson was removed after intra-abdominal carbon dioxide was expressed. The fascia was reaproximated by tying the 0 vicryl suture.   The 5mm skin incision was closed with dermabond.  The 10 mm incision was closed with a  subcuticular suture of 3-0 monocryl. The intrauterine manipulator was then removed.  A cervical laceration was noted at the tenaculum site.  It was repaired with 0 vicryl suture.  Hemostasis was noted.   Instrument, sponge, and needle counts were correct prior to abdominal closure and at the conclusion of the case.  Findings: See above Estimated Blood Loss:  Minimal         Drains: none         Total IV Fluids:Intravenous fluids were administered, normal saline  m         Specimens: none              Complications:  None; patient tolerated the procedure well.         Disposition: PACU - hemodynamically stable.         Condition: stable

## 2014-02-15 ENCOUNTER — Encounter (HOSPITAL_COMMUNITY): Payer: Self-pay | Admitting: Obstetrics and Gynecology

## 2014-02-15 DIAGNOSIS — N838 Other noninflammatory disorders of ovary, fallopian tube and broad ligament: Secondary | ICD-10-CM | POA: Diagnosis not present

## 2014-02-15 MED ORDER — SODIUM CHLORIDE 0.9 % IJ SOLN
3.0000 mL | Freq: Two times a day (BID) | INTRAMUSCULAR | Status: DC
  Administered 2014-02-15: 3 mL via INTRAVENOUS

## 2014-02-15 MED ORDER — SODIUM CHLORIDE 0.9 % IJ SOLN: 3.0000 mL | INTRAMUSCULAR | Status: DC | PRN

## 2014-02-15 MED ORDER — SODIUM CHLORIDE 0.9 % IV SOLN: 250.0000 mL | INTRAVENOUS | Status: DC | PRN

## 2014-02-15 NOTE — Progress Notes (Addendum)
Pt rec'd room 372 via stretcher from PACU following transient tachycardic episode in PACU following surgical procedure by Dr. Normand Sloopillard this afternoon.  Transferred easily to bed and telemetry applied per orders.  Pt anxious on arrival re episode in PACU and states had similar episode several weeks ago.  Became tearful and increased HR noted to 170's.  Radial pulse difficult to palpate, auscultation confirmed HR 170's.  Inc RR noted with anxiety, BP WNL.  O2 sats 98-100.  Pt enc to slow breathing, calming measures utilized.  Report to Dr. Malen GauzeFoster per Helene ShoeJill West, RN (PACU) and order was rec'd for IV Ativan.  Medicated and much calmer, HR in 90's after administration.   Resting quietly at this time - requesting regular diet.

## 2014-02-15 NOTE — Addendum Note (Signed)
Addendum created 02/15/14 0913 by Suella Groveoderick C Arnetha Silverthorne, CRNA   Modules edited: Notes Section   Notes Section:  File: [id number]

## 2014-02-15 NOTE — Anesthesia Postprocedure Evaluation (Signed)
  Anesthesia Post-op Note  Patient: Wendy Ramsey  Procedure(s) Performed: Procedure(s): LAPAROSCOPY OPERATIVE  (N/A) LAPAROSCOPIC BILATERAL SALPINGECTOMY with cervical laceration repair (Bilateral) LAPAROSCOPIC LYSIS OF ADHESIONS (N/A)  Patient Location: A-ICU  Anesthesia Type:General  Level of Consciousness: awake, alert , oriented and patient cooperative  Airway and Oxygen Therapy: Patient Spontanous Breathing  Post-op Pain: none  Post-op Assessment: Post-op Vital signs reviewed, Patient's Cardiovascular Status Stable, Respiratory Function Stable, Patent Airway, No signs of Nausea or vomiting, Adequate PO intake and Pain level controlled  Post-op Vital Signs: Reviewed and stable  Last Vitals:  Filed Vitals:   02/15/14 0818  BP:   Pulse:   Temp: 36.9 C  Resp: 20    Complications: No apparent anesthesia complications

## 2014-02-15 NOTE — Discharge Summary (Signed)
Physician Discharge Summary  Patient ID: CYDNI REDDOCH MRN: [id number] DOB/AGE: 1976/08/07 38 y.o.  Admit date: 02/14/2014 Discharge date: 02/15/2014  Admission Diagnoses:  Ovarian cyst  Discharge Diagnoses: tubal cyst, A fib Active Problems:   Status post laparoscopy   Discharged Condition: good  Hospital Course: Pt came in for LSO and during LS we realized her cyst had resolved.  She had a LS with LOA and and bilateral salpingectomy.  In the recovery room she had an episode of A fib and was admitted to the [facility] for observation.  Today she is stable and in good cardiac rhythm.    Consults: None  Significant Diagnostic Studies: see labs  Treatments: surgery: see above  Discharge Exam: Blood pressure 111/71, pulse 82, temperature 97.9 F (36.6 C), temperature source Oral, resp. rate 18, height _0  (1.575 m), weight 89.359 kg (197 lb), last menstrual period 02/10/2014, SpO2 100.00%. General appearance: alert and cooperative Cardio: regular rate and rhythm GI: soft, non-tender; bowel sounds normal; no masses,  no organomegaly Skin: Skin color, texture, turgor normal. No rashes or lesions Incision/Wound:incisions CDI  Disposition: 01-Home or Self Care  Discharge Instructions   Call MD for:  persistant dizziness or light-headedness    Complete by:  As directed      Call MD for:  severe uncontrolled pain    Complete by:  As directed      Call MD for:  temperature >100.4    Complete by:  As directed      Diet general    Complete by:  As directed      Discharge instructions    Complete by:  As directed   Call for vaginal bleeding if you soak greater than pad per hour     Discharge patient    Complete by:  As directed   Discharge patient once she has met PACU criteria for discharge.     Increase activity slowly    Complete by:  As directed      May shower / Bathe    Complete by:  As directed      Sexual Activity Restrictions    Complete by:  As directed   Pelvic rest.   Nothing in the vagina            Medication List         HYDROcodone-acetaminophen 5-325 MG per tablet  Commonly known as:  NORCO/VICODIN  1-2 TABLETS EVERY 6 HOURS AS NEEDED FOR SEVERE PAIN     ibuprofen 200 MG tablet  Commonly known as:  ADVIL,MOTRIN  1  PO   PC  Q 6 HRS. X 5 DAYS THEN PRN-PAIN     metoprolol succinate 25 MG 24 hr tablet  Commonly known as:  TOPROL-XL  Take 25 mg by mouth daily. Weaning off this medication- will be off medication by surgery date 5/19**     valACYclovir 500 MG tablet  Commonly known as:  VALTREX  Take 500 mg by mouth as needed.           Follow-up Information   Follow up with [facility], MD On 03/06/2014. (Appointment time 4:15 p.m.)    Specialty:  Obstetrics and Gynecology   Contact information:   [address] [place] [address] [phone]       Signed: Betsy Coder 02/15/2014, 1:44 PM

## 2014-02-15 NOTE — Progress Notes (Signed)
UR chart review completed.  

## 2014-02-16 MED ORDER — LORAZEPAM 2 MG/ML IJ SOLN: 1.0000 mg | Freq: Once | INTRAMUSCULAR | Status: AC

## 2014-02-24 ENCOUNTER — Encounter: Payer: Self-pay | Admitting: Cardiovascular Disease

## 2014-02-24 ENCOUNTER — Ambulatory Visit (INDEPENDENT_AMBULATORY_CARE_PROVIDER_SITE_OTHER): Payer: BC Managed Care – PPO | Admitting: Cardiovascular Disease

## 2014-02-24 VITALS — BP 102/70 | HR 84 | Ht 62.0 in | Wt 196.2 lb

## 2014-02-24 DIAGNOSIS — R002 Palpitations: Secondary | ICD-10-CM

## 2014-02-24 DIAGNOSIS — I4891 Unspecified atrial fibrillation: Secondary | ICD-10-CM

## 2014-02-24 DIAGNOSIS — I480 Paroxysmal atrial fibrillation: Secondary | ICD-10-CM

## 2014-02-24 NOTE — Progress Notes (Signed)
02/24/2014 Wendy Ramsey   October 17, 1975  [id number]  Primary Physician Wendy Pitter, MD Primary Cardiologist: Wendy Gess MD Wendy Ramsey   HPI:  Wendy Ramsey is a 38 year old moderately overweight single African American female mother of 2 children works as a Engineer, agricultural. Her primary care physician is Wendy Ramsey .she was referred by [facility] emergency room where she saw Wendy Ramsey for an episode of palpitations which was documented to be paroxysmal atrial fibrillation. Her only cardiac risk factor is tobacco abuse intensivist today. She otherwise denies chest pain or shortness of breath. The episode occurred after eating spicy food. Since I saw her in the office approximately 2 weeks ago she has had recurrent episodes of tachypalpitations suggestive of PAF.    Current Outpatient Prescriptions  Medication Sig Dispense Refill  . HYDROcodone-acetaminophen (NORCO/VICODIN) 5-325 MG per tablet 1-2 TABLETS EVERY 6 HOURS AS NEEDED FOR SEVERE PAIN  30 tablet  0  . ibuprofen (ADVIL,MOTRIN) 200 MG tablet 1  PO   PC  Q 6 HRS. X 5 DAYS THEN PRN-PAIN  30 tablet  1  . valACYclovir (VALTREX) 500 MG tablet Take 500 mg by mouth as needed.       No current facility-administered medications for this visit.   Facility-Administered Medications Ordered in Other Visits  Medication Dose Route Frequency Provider Last Rate Last Dose  . LORazepam (ATIVAN) injection 1 mg  1 mg Intravenous Once Wendy A. Malen Gauze, MD        No Known Allergies  History   Social History  . Marital Status: Single    Spouse Name: N/A    Number of Children: N/A  . Years of Education: N/A   Occupational History  . Not on file.   Social History Main Topics  . Smoking status: Current Every Day Smoker -- 1.00 packs/day for 20 years    Types: Cigarettes  . Smokeless tobacco: Never Used  . Alcohol Use: No  . Drug Use: No  . Sexual Activity: Yes    Birth Control/ Protection: Surgical   Comment: BTL   Other Topics Concern  . Not on file   Social History Narrative  . No narrative on file     Review of Systems: General: negative for chills, fever, night sweats or weight changes.  Cardiovascular: negative for chest pain, dyspnea on exertion, edema, orthopnea, palpitations, paroxysmal nocturnal dyspnea or shortness of breath Dermatological: negative for rash Respiratory: negative for cough or wheezing Urologic: negative for hematuria Abdominal: negative for nausea, vomiting, diarrhea, bright red blood per rectum, melena, or hematemesis Neurologic: negative for visual changes, syncope, or dizziness All other systems reviewed and are otherwise negative except as noted above.    Blood pressure 102/70, pulse 84, height 5\' 2"  (1.575 m), weight 196 lb 3.2 oz (88.996 kg), last menstrual period 01/26/2014.  General appearance: alert and no distress Neck: no adenopathy, no carotid bruit, no JVD, supple, symmetrical, trachea midline and thyroid not enlarged, symmetric, no tenderness/mass/nodules Lungs: clear to auscultation bilaterally Heart: regular rate and rhythm, S1, S2 normal, no murmur, click, rub or gallop Extremities: extremities normal, atraumatic, no cyanosis or edema  EKG normal sinus rhythm at 84 without ST or T wave changes  ASSESSMENT AND PLAN:   Paroxysmal atrial fibrillation Since I saw her about 02/03/14 she has had recurrent episodes of tachycardia palpitations. Her thyroid function tests are normal. I'm going to get a 2-D echocardiogram and a one-month event monitor, and we'll see  her back after that for further evaluation      Wendy GessJonathan J. Anessa Charley MD [facility] - SugarlandFACP,FACC,FAHA, [facility] CenterFSCAI 02/24/2014 12:45 PM

## 2014-02-24 NOTE — Patient Instructions (Signed)
Your physician has requested that you have an echocardiogram. Echocardiography is a painless test that uses sound waves to create images of your heart. It provides your doctor with information about the size and shape of your heart and how well your heart's chambers and valves are working. This procedure takes approximately one hour. There are no restrictions for this procedure.  Your physician has recommended that you wear an event monitor. Event monitors are medical devices that record the heart's electrical activity. Doctors most often [place] these monitors to diagnose arrhythmias. Arrhythmias are problems with the speed or rhythm of the heartbeat. The monitor is a small, portable device. You can wear one while you do your normal daily activities. This is usually used to diagnose what is causing palpitations/syncope (passing out).  Your physician recommends that you schedule a follow-up appointment in 6 weeks.

## 2014-02-24 NOTE — Assessment & Plan Note (Signed)
Since I saw her about 02/03/14 she has had recurrent episodes of tachycardia palpitations. Her thyroid function tests are normal. I'm going to get a 2-D echocardiogram and a one-month event monitor, and we'll see her back after that for further evaluation

## 2014-03-08 ENCOUNTER — Telehealth: Payer: Self-pay | Admitting: Cardiology

## 2014-03-08 NOTE — Telephone Encounter (Signed)
Notified by [employer] of sustained arrhythmia of SVT at 190 bpm. Auto-trigger. Spoke to patient. Asymptomatic when I spoke to her and does not recall any palpitations in the last 10 minutes (noted phone call in Epic when she triggered the episode- she was symptomatic then) . Reports pulse is normal. No red flag symptoms. To follow-up with Dr. Allyson Sabal.

## 2014-03-08 NOTE — Telephone Encounter (Signed)
Was notified by [employer] that patient had an episode of SVT that occurred at 7:19 pm. The event lasted for 40 seconds. Max HR was 212 bpm. Pt reported palpitations. I attempted to contact the patient directly, but received voice mail. Rhythm strips will be faxed to our office for Dr. Allyson Sabal to review.   Robbie Lis, PA-C

## 2014-03-09 NOTE — Telephone Encounter (Signed)
Forward to Dr Rockie Neighbours RN  Report obtained ,will be shown to Dr Allyson Sabal this afternoon when he arrives in the office.

## 2014-03-09 NOTE — Telephone Encounter (Signed)
northline patient, will re-route.

## 2014-03-10 NOTE — Telephone Encounter (Signed)
Dr Allyson SabalBerry reviewed the strips yesterday afternoon. He wants to refer her to a EP MD.  I lm for patient to call me.

## 2014-03-14 NOTE — Telephone Encounter (Signed)
lmom 

## 2014-03-17 ENCOUNTER — Ambulatory Visit (HOSPITAL_COMMUNITY)
Admission: RE | Admit: 2014-03-17 | Discharge: 2014-03-17 | Disposition: A | Discharge status: Home / Self Care | Payer: BC Managed Care – PPO | Source: Ambulatory Visit | Attending: Cardiovascular Disease | Admitting: Cardiovascular Disease

## 2014-03-17 DIAGNOSIS — R002 Palpitations: Secondary | ICD-10-CM | POA: Insufficient documentation

## 2014-03-17 DIAGNOSIS — I369 Nonrheumatic tricuspid valve disorder, unspecified: Secondary | ICD-10-CM

## 2014-03-17 NOTE — Progress Notes (Signed)
2D Echocardiogram Complete.  03/17/2014   Bethany McMahill, RDCS 

## 2014-03-23 ENCOUNTER — Encounter: Payer: Self-pay | Admitting: Cardiovascular Disease

## 2014-03-23 ENCOUNTER — Encounter: Payer: Self-pay | Admitting: *Deleted

## 2014-03-23 ENCOUNTER — Telehealth: Payer: Self-pay | Admitting: *Deleted

## 2014-03-23 DIAGNOSIS — I471 Supraventricular tachycardia: Secondary | ICD-10-CM

## 2014-03-23 NOTE — Telephone Encounter (Signed)
Message copied by Marella BileVOGEL, KATHRYN W. on Thu Mar 23, 2014  3:43 PM ------      Message from: Runell GessBERRY, JONATHAN J      Created: Thu Mar 23, 2014 12:36 PM       Call and tell normal ------

## 2014-03-28 ENCOUNTER — Other Ambulatory Visit: Payer: Self-pay | Admitting: *Deleted

## 2014-03-28 DIAGNOSIS — I480 Paroxysmal atrial fibrillation: Secondary | ICD-10-CM

## 2014-03-28 DIAGNOSIS — R002 Palpitations: Secondary | ICD-10-CM

## 2014-03-29 NOTE — Telephone Encounter (Signed)
Letter mailed to patient.

## 2014-04-14 ENCOUNTER — Ambulatory Visit (INDEPENDENT_AMBULATORY_CARE_PROVIDER_SITE_OTHER): Payer: BC Managed Care – PPO | Admitting: Cardiovascular Disease

## 2014-04-14 ENCOUNTER — Encounter: Payer: Self-pay | Admitting: Cardiovascular Disease

## 2014-04-14 VITALS — BP 114/76 | HR 94 | Ht 62.0 in | Wt 197.0 lb

## 2014-04-14 DIAGNOSIS — I480 Paroxysmal atrial fibrillation: Secondary | ICD-10-CM

## 2014-04-14 DIAGNOSIS — I4891 Unspecified atrial fibrillation: Secondary | ICD-10-CM

## 2014-04-14 NOTE — Progress Notes (Signed)
Wendy Ramsey returns today for followup. A 2-D echocardiogram was normal. And the monitor showed episodes of PSVT with heart rates up to 190. I'm going to refer her to Dr. Hillis RangeJames Allred for further evaluation.  Runell GessJonathan J. Brizza Nathanson, M.D., FACP, [facility] HospitalFACC, Earl LagosFAHA, [facility] HospitalFSCAI [facility] [address] [place], [place]  [id number]  [phone] 04/14/2014 9:48 AM

## 2014-04-14 NOTE — Patient Instructions (Addendum)
Your physician recommends that you schedule a follow-up appointment in: 6 Months with PA and 12 Months with Dr Berry  You hSan Morelleave been referred to Dr Johney FrameAllred

## 2014-04-14 NOTE — Assessment & Plan Note (Signed)
Her 2-D echocardiogram was normal. The monitor showed episodes of PSVT with heart rates up to 190. I am going to refer her to Dr. Mosie LukesJames Albright for EP evaluation.

## 2014-07-31 ENCOUNTER — Encounter: Payer: Self-pay | Admitting: Cardiovascular Disease

## 2015-11-29 ENCOUNTER — Other Ambulatory Visit: Payer: Self-pay | Admitting: Family Medicine

## 2015-11-29 ENCOUNTER — Ambulatory Visit
Admission: RE | Admit: 2015-11-29 | Discharge: 2015-11-29 | Disposition: A | Discharge status: Home / Self Care | Payer: BLUE CROSS/BLUE SHIELD | Source: Ambulatory Visit | Attending: Family Medicine | Admitting: Family Medicine

## 2015-11-29 DIAGNOSIS — M25511 Pain in right shoulder: Secondary | ICD-10-CM

## 2016-01-04 DIAGNOSIS — R7303 Prediabetes: Secondary | ICD-10-CM | POA: Diagnosis not present

## 2016-01-04 DIAGNOSIS — G5600 Carpal tunnel syndrome, unspecified upper limb: Secondary | ICD-10-CM | POA: Diagnosis not present

## 2016-01-04 DIAGNOSIS — F17200 Nicotine dependence, unspecified, uncomplicated: Secondary | ICD-10-CM | POA: Diagnosis not present

## 2016-01-04 DIAGNOSIS — E785 Hyperlipidemia, unspecified: Secondary | ICD-10-CM | POA: Diagnosis not present

## 2016-01-08 ENCOUNTER — Ambulatory Visit (INDEPENDENT_AMBULATORY_CARE_PROVIDER_SITE_OTHER): Payer: BLUE CROSS/BLUE SHIELD | Admitting: Neurology

## 2016-01-08 ENCOUNTER — Encounter: Payer: Self-pay | Admitting: Neurology

## 2016-01-08 ENCOUNTER — Ambulatory Visit (INDEPENDENT_AMBULATORY_CARE_PROVIDER_SITE_OTHER): Payer: Self-pay | Admitting: Neurology

## 2016-01-08 DIAGNOSIS — M79601 Pain in right arm: Secondary | ICD-10-CM

## 2016-01-08 DIAGNOSIS — N83202 Unspecified ovarian cyst, left side: Secondary | ICD-10-CM

## 2016-01-08 NOTE — Progress Notes (Signed)
Please refer to EMG and nerve conduction study procedure note. 

## 2016-01-08 NOTE — Procedures (Signed)
     HISTORY:  Wendy Ramsey is a 40 year old patient with a 3-4 month history of right shoulder discomfort with pain going down the arm into the hand. The patient has occasional numbness of the hand. She denies any neck discomfort. She believes that the right arm is slightly weak. She is being evaluated for a possible neuropathy or a cervical radiculopathy.  NERVE CONDUCTION STUDIES:  Nerve conduction studies were performed on both upper extremities. The distal motor latencies and motor amplitudes for the median and ulnar nerves were within normal limits. The F wave latencies and nerve conduction velocities for these nerves were also normal. The sensory latencies for the median and ulnar nerves were normal.   EMG STUDIES:  EMG study was performed on the right upper extremity:  The first dorsal interosseous muscle reveals 2 to 4 K units with full recruitment. No fibrillations or positive waves were noted. The abductor pollicis brevis muscle reveals 2 to 4 K units with full recruitment. No fibrillations or positive waves were noted. The extensor indicis proprius muscle reveals 1 to 3 K units with full recruitment. No fibrillations or positive waves were noted. The pronator teres muscle reveals 2 to 3 K units with full recruitment. No fibrillations or positive waves were noted. The biceps muscle reveals 1 to 2 K units with full recruitment. No fibrillations or positive waves were noted. The triceps muscle reveals 2 to 4 K units with full recruitment. No fibrillations or positive waves were noted. The anterior deltoid muscle reveals 2 to 3 K units with full recruitment. No fibrillations or positive waves were noted. The cervical paraspinal muscles were tested at 2 levels. No abnormalities of insertional activity were seen at either level tested. There was good relaxation.   IMPRESSION:  Nerve conduction studies done on both upper extremities were within normal limits. No evidence of a  neuropathy is seen. EMG evaluation of the right upper extremity was unremarkable, without evidence of an overlying cervical radiculopathy.  Marlan Palau. Keith Suanne Minahan MD 01/08/2016 4:29 PM  [facility] [address] [place], [place] [id number]  Phone [phone] Fax [phone]

## 2016-06-03 DIAGNOSIS — E785 Hyperlipidemia, unspecified: Secondary | ICD-10-CM | POA: Diagnosis not present

## 2016-06-03 DIAGNOSIS — M130 Polyarthritis, unspecified: Secondary | ICD-10-CM | POA: Diagnosis not present

## 2016-06-03 DIAGNOSIS — R739 Hyperglycemia, unspecified: Secondary | ICD-10-CM | POA: Diagnosis not present

## 2016-06-03 DIAGNOSIS — Z6833 Body mass index (BMI) 33.0-33.9, adult: Secondary | ICD-10-CM | POA: Diagnosis not present

## 2016-07-10 DIAGNOSIS — Z23 Encounter for immunization: Secondary | ICD-10-CM | POA: Diagnosis not present

## 2016-11-05 DIAGNOSIS — R7309 Other abnormal glucose: Secondary | ICD-10-CM | POA: Diagnosis not present

## 2016-11-05 DIAGNOSIS — Z23 Encounter for immunization: Secondary | ICD-10-CM | POA: Diagnosis not present

## 2016-11-05 DIAGNOSIS — M130 Polyarthritis, unspecified: Secondary | ICD-10-CM | POA: Diagnosis not present

## 2016-11-05 DIAGNOSIS — Z0001 Encounter for general adult medical examination with abnormal findings: Secondary | ICD-10-CM | POA: Diagnosis not present

## 2016-11-05 DIAGNOSIS — E781 Pure hyperglyceridemia: Secondary | ICD-10-CM | POA: Diagnosis not present

## 2016-11-05 DIAGNOSIS — R7303 Prediabetes: Secondary | ICD-10-CM | POA: Diagnosis not present

## 2016-12-18 DIAGNOSIS — Z6832 Body mass index (BMI) 32.0-32.9, adult: Secondary | ICD-10-CM | POA: Diagnosis not present

## 2016-12-18 DIAGNOSIS — B009 Herpesviral infection, unspecified: Secondary | ICD-10-CM | POA: Diagnosis not present

## 2016-12-18 DIAGNOSIS — Z01419 Encounter for gynecological examination (general) (routine) without abnormal findings: Secondary | ICD-10-CM | POA: Diagnosis not present

## 2016-12-18 DIAGNOSIS — Z1231 Encounter for screening mammogram for malignant neoplasm of breast: Secondary | ICD-10-CM | POA: Diagnosis not present

## 2017-03-11 DIAGNOSIS — M130 Polyarthritis, unspecified: Secondary | ICD-10-CM | POA: Diagnosis not present

## 2017-03-11 DIAGNOSIS — E781 Pure hyperglyceridemia: Secondary | ICD-10-CM | POA: Diagnosis not present

## 2017-03-11 DIAGNOSIS — R7309 Other abnormal glucose: Secondary | ICD-10-CM | POA: Diagnosis not present

## 2017-03-13 DIAGNOSIS — R739 Hyperglycemia, unspecified: Secondary | ICD-10-CM | POA: Diagnosis not present

## 2017-03-13 DIAGNOSIS — E781 Pure hyperglyceridemia: Secondary | ICD-10-CM | POA: Diagnosis not present

## 2017-03-13 DIAGNOSIS — M130 Polyarthritis, unspecified: Secondary | ICD-10-CM | POA: Diagnosis not present

## 2017-07-17 DIAGNOSIS — I10 Essential (primary) hypertension: Secondary | ICD-10-CM | POA: Diagnosis not present

## 2017-07-17 DIAGNOSIS — R7309 Other abnormal glucose: Secondary | ICD-10-CM | POA: Diagnosis not present

## 2017-07-29 ENCOUNTER — Telehealth: Payer: Self-pay | Admitting: Family Medicine

## 2017-07-29 NOTE — Telephone Encounter (Signed)
Spoke with pt after discussing with Dr. Clelia CroftShaw.  Dr. Clelia CroftShaw states effects will resolve and she should not have any issues. Told pt to call and come in if anything changes, but she should not have any ill effects.

## 2017-07-29 NOTE — Telephone Encounter (Signed)
Pt states that she had stuck herself with her sons epi pen on her finger and its numb and tingle she wants to know if she will be ok or do she need to come into office she states that she feels fine

## 2017-07-30 DIAGNOSIS — Z23 Encounter for immunization: Secondary | ICD-10-CM | POA: Diagnosis not present

## 2017-11-18 DIAGNOSIS — Z01411 Encounter for gynecological examination (general) (routine) with abnormal findings: Secondary | ICD-10-CM | POA: Diagnosis not present

## 2017-11-18 DIAGNOSIS — R739 Hyperglycemia, unspecified: Secondary | ICD-10-CM | POA: Diagnosis not present

## 2017-11-18 DIAGNOSIS — M130 Polyarthritis, unspecified: Secondary | ICD-10-CM | POA: Diagnosis not present

## 2017-11-18 DIAGNOSIS — Z0001 Encounter for general adult medical examination with abnormal findings: Secondary | ICD-10-CM | POA: Diagnosis not present

## 2017-12-22 DIAGNOSIS — Z6832 Body mass index (BMI) 32.0-32.9, adult: Secondary | ICD-10-CM | POA: Diagnosis not present

## 2017-12-22 DIAGNOSIS — B009 Herpesviral infection, unspecified: Secondary | ICD-10-CM | POA: Diagnosis not present

## 2017-12-22 DIAGNOSIS — Z01419 Encounter for gynecological examination (general) (routine) without abnormal findings: Secondary | ICD-10-CM | POA: Diagnosis not present

## 2017-12-22 DIAGNOSIS — Z1231 Encounter for screening mammogram for malignant neoplasm of breast: Secondary | ICD-10-CM | POA: Diagnosis not present

## 2018-05-17 DIAGNOSIS — R7309 Other abnormal glucose: Secondary | ICD-10-CM | POA: Diagnosis not present

## 2018-05-17 DIAGNOSIS — Z6833 Body mass index (BMI) 33.0-33.9, adult: Secondary | ICD-10-CM | POA: Diagnosis not present

## 2018-05-17 DIAGNOSIS — E785 Hyperlipidemia, unspecified: Secondary | ICD-10-CM | POA: Diagnosis not present

## 2018-05-17 DIAGNOSIS — I10 Essential (primary) hypertension: Secondary | ICD-10-CM | POA: Diagnosis not present

## 2018-06-01 DIAGNOSIS — H524 Presbyopia: Secondary | ICD-10-CM | POA: Diagnosis not present

## 2018-06-01 DIAGNOSIS — H5211 Myopia, right eye: Secondary | ICD-10-CM | POA: Diagnosis not present

## 2018-06-04 DIAGNOSIS — H353130 Nonexudative age-related macular degeneration, bilateral, stage unspecified: Secondary | ICD-10-CM | POA: Diagnosis not present

## 2018-07-02 ENCOUNTER — Encounter (INDEPENDENT_AMBULATORY_CARE_PROVIDER_SITE_OTHER): Payer: BLUE CROSS/BLUE SHIELD | Admitting: Ophthalmology

## 2018-07-02 DIAGNOSIS — H33302 Unspecified retinal break, left eye: Secondary | ICD-10-CM

## 2018-07-02 DIAGNOSIS — H2513 Age-related nuclear cataract, bilateral: Secondary | ICD-10-CM

## 2018-07-02 DIAGNOSIS — H353132 Nonexudative age-related macular degeneration, bilateral, intermediate dry stage: Secondary | ICD-10-CM | POA: Diagnosis not present

## 2018-07-02 DIAGNOSIS — H43813 Vitreous degeneration, bilateral: Secondary | ICD-10-CM

## 2018-07-19 ENCOUNTER — Encounter (INDEPENDENT_AMBULATORY_CARE_PROVIDER_SITE_OTHER): Payer: BLUE CROSS/BLUE SHIELD | Admitting: Ophthalmology

## 2018-07-19 DIAGNOSIS — H33302 Unspecified retinal break, left eye: Secondary | ICD-10-CM

## 2018-07-23 DIAGNOSIS — Z23 Encounter for immunization: Secondary | ICD-10-CM | POA: Diagnosis not present

## 2018-08-09 ENCOUNTER — Encounter (HOSPITAL_COMMUNITY): Payer: Self-pay | Admitting: Emergency Medicine

## 2018-08-09 ENCOUNTER — Ambulatory Visit (HOSPITAL_COMMUNITY)
Admission: EM | Admit: 2018-08-09 | Discharge: 2018-08-09 | Disposition: A | Discharge status: Home / Self Care | Payer: BLUE CROSS/BLUE SHIELD | Attending: Family Medicine | Admitting: Family Medicine

## 2018-08-09 ENCOUNTER — Other Ambulatory Visit: Payer: Self-pay

## 2018-08-09 DIAGNOSIS — M25561 Pain in right knee: Secondary | ICD-10-CM

## 2018-08-09 DIAGNOSIS — M778 Other enthesopathies, not elsewhere classified: Secondary | ICD-10-CM | POA: Diagnosis not present

## 2018-08-09 MED ORDER — MELOXICAM 7.5 MG PO TABS: 7.5000 mg | ORAL_TABLET | Freq: Every day | ORAL | 0 refills | Status: DC

## 2018-08-09 NOTE — Discharge Instructions (Signed)
Ice and elevation of elbow and knee at the end of the day.  Meloxicam daily to help with pain. Don't take additional ibuprofen, may take additional tylenol if needed.  See elbow exercises as needed.  Light and regular activity as tolerated.  Please follow up with your primary care provider and/or orthopedics if your symptoms persist or do not improve in the next 2 weeks.

## 2018-08-09 NOTE — ED Provider Notes (Signed)
[facility]    CSN: [id number] Arrival date & time: 08/09/18  4098     History   Chief Complaint Chief Complaint  Patient presents with  . Arm Pain    HPI Wendy Ramsey is a 42 y.o. female.   Kaliyan presents with complaints of burning pain to medial left elbow as well as medial right knee. Elbow pain has been intermittent for the past 2 weeks and knee pain intermittent for the past 1.5 weeks. No specific injury to either. Certain positions or movements can trigger the pain. Denies any previous similar. Has tried ibuprofen twice a day which has not helped. No numbness or tingling. Occasional swelling. She is right handed. No fevers. Pain doesn't radiate. States she has had chronic left shoulder pain. No wrist or hand pain. No hip or ankle pain. Hx of herpes, a fib. She is on a medication for prediabetes but doesn't remember what it is.     ROS per HPI.      Past Medical History:  Diagnosis Date  . Dysrhythmia   . 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 Paroxysmal atrial fibrillation [facility])     Patient Active Problem List   Diagnosis Date Noted  . Status post laparoscopy 02/14/2014  . Paroxysmal atrial fibrillation (HCC) 02/03/2014  . Ovarian cyst, left 12/13/2013  . Metrorrhagia 10/11/2012    Past Surgical History:  Procedure Laterality Date  . CESAREAN SECTION     2 times   . LAPAROSCOPIC BILATERAL SALPINGECTOMY Bilateral 02/14/2014   Procedure: LAPAROSCOPIC BILATERAL SALPINGECTOMY with cervical laceration repair;  Surgeon: Michael Litter, MD;  Location: [facility];  Service: Gynecology;  Laterality: Bilateral;  . LAPAROSCOPIC LYSIS OF ADHESIONS N/A 02/14/2014   Procedure: LAPAROSCOPIC LYSIS OF ADHESIONS;  Surgeon: Michael Litter, MD;  Location: [facility];  Service: Gynecology;  Laterality: N/A;  . LAPAROSCOPY N/A 02/14/2014   Procedure: LAPAROSCOPY OPERATIVE ;  Surgeon: Michael Litter, MD;  Location: [facility];  Service: Gynecology;   Laterality: N/A;  . TUBAL LIGATION      OB History    Gravida  2   Para  2   Term  2   Preterm      AB      Living  2     SAB      TAB      Ectopic      Multiple      Live Births               Home Medications    Prior to Admission medications   Medication Sig Start Date End Date Taking? Authorizing Provider  meloxicam (MOBIC) 7.5 MG tablet Take 1 tablet (7.5 mg total) by mouth daily. 08/09/18   Georgetta Haber, NP  valACYclovir (VALTREX) 500 MG tablet Take 500 mg by mouth as needed.    [provider]    Family History Family History  Problem Relation Age of Onset  . Diabetes Maternal Grandmother   . Cancer Maternal Grandfather   . Cancer Father   . Diabetes Mother     Social History Social History   Tobacco Use  . Smoking status: Current Every Day Smoker    Packs/day: 1.00    Years: 20.00    Pack years: 20.00    Types: Cigarettes  . Smokeless tobacco: Never Used  Substance Use Topics  . Alcohol use: No  . Drug use: No  Allergies   Patient has no known allergies.   Review of Systems Review of Systems   Physical Exam Triage Vital Signs ED Triage Vitals  Enc Vitals Group     BP 08/09/18 1001 109/70     Pulse Rate 08/09/18 1001 96     Resp 08/09/18 1001 20     Temp 08/09/18 1001 98.2 F (36.8 C)     Temp Source 08/09/18 1001 Oral     SpO2 08/09/18 1001 99 %     Weight --      Height --      Head Circumference --      Peak Flow --      Pain Score 08/09/18 0957 8     Pain Loc --      Pain Edu? --      Excl. in GC? --    No data found.  Updated Vital Signs BP 109/70 (BP Location: Left Arm)   Pulse 96   Temp 98.2 F (36.8 C) (Oral)   Resp 20   SpO2 99%    Physical Exam  Constitutional: She is oriented to person, place, and time. She appears well-developed and well-nourished. No distress.  Cardiovascular: Normal rate, regular rhythm and normal heart sounds.  Pulmonary/Chest: Effort normal and breath  sounds normal.  Musculoskeletal:       Left shoulder: She exhibits tenderness and pain. She exhibits normal range of motion, no bony tenderness, no swelling, no effusion, no crepitus, no deformity, no laceration, no spasm, normal pulse and normal strength.       Left elbow: She exhibits normal range of motion, no swelling, no effusion, no deformity and no laceration. Tenderness found.       Right knee: She exhibits normal range of motion, no swelling, no effusion, no ecchymosis, no deformity, no laceration, no erythema, normal alignment, no LCL laxity, normal patellar mobility, no bony tenderness, normal meniscus and no MCL laxity. Tenderness found.       Arms: Tenderness to region of left distal bicep tendon; no pain with elbow ROM; strength equal bilaterally; gross sensation intact to upper extremities; no pain specifically with engagement of left bicep, no bicep abnormality; generalized left shoulder pain, no bony tenderness; strong radial pulse; posterior and medial right knee soft tissue tenderness; no redness or swelling; no laxity; no point tenderness or bruising; no pain with flexion/extension of knee; calf is soft and non tender; strength equal bilaterally; gross sensation intact to lower extremities   Neurological: She is alert and oriented to person, place, and time.  Skin: Skin is warm and dry.     UC Treatments / Results  Labs (all labs ordered are listed, but only abnormal results are displayed) Labs Reviewed - No data to display  EKG None  Radiology No results found.  Procedures Procedures (including critical care time)  Medications Ordered in UC Medications - No data to display  Initial Impression / Assessment and Plan / UC Course  I have reviewed the triage vital signs and the nursing notes.  Pertinent labs & imaging results that were available during my care of the patient were reviewed by me and considered in my medical decision making (see chart for details).       Left elbow tendonitis vs strain discussed; no injury; no redness, swelling or warmth to either joint; meniscus vs tendonitis right knee discussed. Supportive cares recommended at this time. Ice, elevation, meloxicam daily. Follow up with pcp and/or ortho for persistent symptoms. Patient verbalized  understanding and agreeable to plan.  Ambulatory out of clinic without difficulty.    Final Clinical Impressions(s) / UC Diagnoses   Final diagnoses:  Elbow tendonitis  Acute pain of right knee     Discharge Instructions     Ice and elevation of elbow and knee at the end of the day.  Meloxicam daily to help with pain. Don't take additional ibuprofen, may take additional tylenol if needed.  See elbow exercises as needed.  Light and regular activity as tolerated.  Please follow up with your primary care provider and/or orthopedics if your symptoms persist or do not improve in the next 2 weeks.    ED Prescriptions    Medication Sig Dispense Auth. Provider   meloxicam (MOBIC) 7.5 MG tablet Take 1 tablet (7.5 mg total) by mouth daily. 20 tablet Georgetta Haber, NP     Controlled Substance Prescriptions [place] Controlled Substance Registry consulted? Not Applicable   Georgetta Haber, NP 08/09/18 1034

## 2018-08-09 NOTE — ED Triage Notes (Signed)
Left arm pain at left elbow for 2 weeks, described as a burning feeling.  Reports same sensation at inside of right knee, also with a burning feeling

## 2018-11-19 DIAGNOSIS — M130 Polyarthritis, unspecified: Secondary | ICD-10-CM | POA: Diagnosis not present

## 2018-11-19 DIAGNOSIS — R7309 Other abnormal glucose: Secondary | ICD-10-CM | POA: Diagnosis not present

## 2018-11-19 DIAGNOSIS — I10 Essential (primary) hypertension: Secondary | ICD-10-CM | POA: Diagnosis not present

## 2018-11-19 DIAGNOSIS — E785 Hyperlipidemia, unspecified: Secondary | ICD-10-CM | POA: Diagnosis not present

## 2018-11-22 ENCOUNTER — Encounter (INDEPENDENT_AMBULATORY_CARE_PROVIDER_SITE_OTHER): Payer: BLUE CROSS/BLUE SHIELD | Admitting: Ophthalmology

## 2018-11-23 DIAGNOSIS — Z0000 Encounter for general adult medical examination without abnormal findings: Secondary | ICD-10-CM | POA: Diagnosis not present

## 2018-11-23 DIAGNOSIS — I10 Essential (primary) hypertension: Secondary | ICD-10-CM | POA: Diagnosis not present

## 2018-11-23 DIAGNOSIS — E1100 Type 2 diabetes mellitus with hyperosmolarity without nonketotic hyperglycemic-hyperosmolar coma (NKHHC): Secondary | ICD-10-CM | POA: Diagnosis not present

## 2018-11-23 DIAGNOSIS — Z6834 Body mass index (BMI) 34.0-34.9, adult: Secondary | ICD-10-CM | POA: Diagnosis not present

## 2018-12-10 DIAGNOSIS — I10 Essential (primary) hypertension: Secondary | ICD-10-CM | POA: Diagnosis not present

## 2018-12-10 DIAGNOSIS — E781 Pure hyperglyceridemia: Secondary | ICD-10-CM | POA: Diagnosis not present

## 2018-12-10 DIAGNOSIS — J069 Acute upper respiratory infection, unspecified: Secondary | ICD-10-CM | POA: Diagnosis not present

## 2018-12-10 DIAGNOSIS — E785 Hyperlipidemia, unspecified: Secondary | ICD-10-CM | POA: Diagnosis not present

## 2018-12-10 DIAGNOSIS — G580 Intercostal neuropathy: Secondary | ICD-10-CM | POA: Diagnosis not present

## 2018-12-28 DIAGNOSIS — Z01419 Encounter for gynecological examination (general) (routine) without abnormal findings: Secondary | ICD-10-CM | POA: Diagnosis not present

## 2018-12-28 DIAGNOSIS — Z1231 Encounter for screening mammogram for malignant neoplasm of breast: Secondary | ICD-10-CM | POA: Diagnosis not present

## 2018-12-28 DIAGNOSIS — Z124 Encounter for screening for malignant neoplasm of cervix: Secondary | ICD-10-CM | POA: Diagnosis not present

## 2018-12-28 DIAGNOSIS — Z6832 Body mass index (BMI) 32.0-32.9, adult: Secondary | ICD-10-CM | POA: Diagnosis not present

## 2018-12-28 DIAGNOSIS — Z720 Tobacco use: Secondary | ICD-10-CM | POA: Diagnosis not present

## 2019-05-24 DIAGNOSIS — I10 Essential (primary) hypertension: Secondary | ICD-10-CM | POA: Diagnosis not present

## 2019-05-24 DIAGNOSIS — E1169 Type 2 diabetes mellitus with other specified complication: Secondary | ICD-10-CM | POA: Diagnosis not present

## 2019-05-24 DIAGNOSIS — M130 Polyarthritis, unspecified: Secondary | ICD-10-CM | POA: Diagnosis not present

## 2019-05-24 DIAGNOSIS — E785 Hyperlipidemia, unspecified: Secondary | ICD-10-CM | POA: Diagnosis not present

## 2019-07-27 DIAGNOSIS — Z23 Encounter for immunization: Secondary | ICD-10-CM | POA: Diagnosis not present

## 2020-01-19 DIAGNOSIS — I10 Essential (primary) hypertension: Secondary | ICD-10-CM | POA: Diagnosis not present

## 2020-01-19 DIAGNOSIS — E1169 Type 2 diabetes mellitus with other specified complication: Secondary | ICD-10-CM | POA: Diagnosis not present

## 2020-01-19 DIAGNOSIS — E785 Hyperlipidemia, unspecified: Secondary | ICD-10-CM | POA: Diagnosis not present

## 2020-01-19 DIAGNOSIS — Z0001 Encounter for general adult medical examination with abnormal findings: Secondary | ICD-10-CM | POA: Diagnosis not present

## 2020-01-24 DIAGNOSIS — Z1231 Encounter for screening mammogram for malignant neoplasm of breast: Secondary | ICD-10-CM | POA: Diagnosis not present

## 2020-01-24 DIAGNOSIS — Z1239 Encounter for other screening for malignant neoplasm of breast: Secondary | ICD-10-CM | POA: Diagnosis not present

## 2020-01-24 DIAGNOSIS — R309 Painful micturition, unspecified: Secondary | ICD-10-CM | POA: Diagnosis not present

## 2020-01-24 DIAGNOSIS — B009 Herpesviral infection, unspecified: Secondary | ICD-10-CM | POA: Diagnosis not present

## 2020-01-24 DIAGNOSIS — Z01419 Encounter for gynecological examination (general) (routine) without abnormal findings: Secondary | ICD-10-CM | POA: Diagnosis not present

## 2020-05-17 DIAGNOSIS — H40013 Open angle with borderline findings, low risk, bilateral: Secondary | ICD-10-CM | POA: Diagnosis not present

## 2020-05-17 DIAGNOSIS — H353131 Nonexudative age-related macular degeneration, bilateral, early dry stage: Secondary | ICD-10-CM | POA: Diagnosis not present

## 2020-05-17 DIAGNOSIS — H40033 Anatomical narrow angle, bilateral: Secondary | ICD-10-CM | POA: Diagnosis not present

## 2020-05-17 DIAGNOSIS — H52223 Regular astigmatism, bilateral: Secondary | ICD-10-CM | POA: Diagnosis not present

## 2020-05-25 DIAGNOSIS — E1165 Type 2 diabetes mellitus with hyperglycemia: Secondary | ICD-10-CM | POA: Diagnosis not present

## 2020-05-25 DIAGNOSIS — M791 Myalgia, unspecified site: Secondary | ICD-10-CM | POA: Diagnosis not present

## 2020-05-25 DIAGNOSIS — I10 Essential (primary) hypertension: Secondary | ICD-10-CM | POA: Diagnosis not present

## 2020-05-31 ENCOUNTER — Ambulatory Visit (HOSPITAL_COMMUNITY)
Admission: EM | Admit: 2020-05-31 | Discharge: 2020-05-31 | Disposition: A | Discharge status: Home / Self Care | Payer: BC Managed Care – PPO

## 2020-05-31 ENCOUNTER — Encounter (HOSPITAL_COMMUNITY): Payer: Self-pay | Admitting: Emergency Medicine

## 2020-05-31 ENCOUNTER — Other Ambulatory Visit: Payer: Self-pay

## 2020-05-31 DIAGNOSIS — M940 Chondrocostal junction syndrome [Tietze]: Secondary | ICD-10-CM

## 2020-05-31 HISTORY — DX: Type 2 diabetes mellitus without complications: E11.9

## 2020-05-31 MED ORDER — MELOXICAM 7.5 MG PO TABS: 7.5000 mg | ORAL_TABLET | Freq: Two times a day (BID) | ORAL | 0 refills | Status: AC

## 2020-05-31 MED ORDER — DICLOFENAC SODIUM 1 % EX GEL: 4.0000 g | Freq: Four times a day (QID) | CUTANEOUS | 0 refills | Status: DC

## 2020-05-31 MED ORDER — TIZANIDINE HCL 4 MG PO TABS: 4.0000 mg | ORAL_TABLET | Freq: Every day | ORAL | 0 refills | Status: AC

## 2020-05-31 NOTE — Discharge Instructions (Signed)
Start taking the Mobic once a day, you may increase this to twice a day with meals Apply the gel to area of pain  Take the muscle relaxer tizanidine/Zanaflex only at night, this will make you sleepy do not drive drink alcohol or operate machinery within 8 hours of taking  Try not to wear tight fitting bras across the area of pain  If pain were to change radiate more into your chest you have shortness of breath, vomiting or sweating go to the emergency department  Follow-up with your primary care next week.

## 2020-05-31 NOTE — ED Triage Notes (Signed)
Pt c/o left sided chest pain near her ribs onset 3 days ago. Pt states that the pain radiates to her shoulder. She has taken ibuprofen with no relief. She states it hurts to take a deep breath. 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

## 2020-05-31 NOTE — ED Provider Notes (Signed)
[facility]    CSN: [id number] Arrival date & time: 05/31/20  6294      History   Chief Complaint Chief Complaint  Patient presents with  . Chest Pain  . Shoulder Pain    HPI Wendy Ramsey is a 44 y.o. female.   Patient presents with left sided rib and back pain.  She reports this is been going on for the last week or so.  Reports she saw her primary care provider last week for similar on the right and left, only higher up.  She reports since then the left lower ribs have began to be painful around her back and under her breast.  She reports a sharp shooting pain.  Denies shortness of breath.  Does endorse pain with breathing however.  No nausea.  No radiation of the upper chest or jaw.  Does not radiate into the shoulder.  No fatigue.  Touch and movement makes the pain worse.     Past Medical History:  Diagnosis Date  . Diabetes mellitus without complication (HCC)   . Dysrhythmia   . 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 Paroxysmal atrial fibrillation [facility])     Patient Active Problem List   Diagnosis Date Noted  . Status post laparoscopy 02/14/2014  . Paroxysmal atrial fibrillation (HCC) 02/03/2014  . Ovarian cyst, left 12/13/2013  . Metrorrhagia 10/11/2012    Past Surgical History:  Procedure Laterality Date  . CESAREAN SECTION     2 times   . LAPAROSCOPIC BILATERAL SALPINGECTOMY Bilateral 02/14/2014   Procedure: LAPAROSCOPIC BILATERAL SALPINGECTOMY with cervical laceration repair;  Surgeon: Michael Litter, MD;  Location: [facility];  Service: Gynecology;  Laterality: Bilateral;  . LAPAROSCOPIC LYSIS OF ADHESIONS N/A 02/14/2014   Procedure: LAPAROSCOPIC LYSIS OF ADHESIONS;  Surgeon: Michael Litter, MD;  Location: [facility];  Service: Gynecology;  Laterality: N/A;  . LAPAROSCOPY N/A 02/14/2014   Procedure: LAPAROSCOPY OPERATIVE ;  Surgeon: Michael Litter, MD;  Location: [facility];  Service: Gynecology;  Laterality: N/A;  . TUBAL LIGATION        OB History    Gravida  2   Para  2   Term  2   Preterm      AB      Living  2     SAB      TAB      Ectopic      Multiple      Live Births               Home Medications    Prior to Admission medications   Medication Sig Start Date End Date Taking? Authorizing Provider  diclofenac Sodium (VOLTAREN) 1 % GEL Apply 4 g topically 4 (four) times daily. 05/31/20   Derian Dimalanta, Veryl Speak, PA-C  ertugliflozin L-PyroglutamicAc (STEGLATRO) 15 MG TABS tablet Take 15 mg by mouth daily.    [provider]  meloxicam (MOBIC) 7.5 MG tablet Take 1 tablet (7.5 mg total) by mouth 2 (two) times daily after a meal for 7 days. 05/31/20 06/07/20  Darrie Macmillan, Veryl Speak, PA-C  tiZANidine (ZANAFLEX) 4 MG tablet Take 1 tablet (4 mg total) by mouth at bedtime for 7 days. 05/31/20 06/07/20  Natale Barba, Veryl Speak, PA-C  valACYclovir (VALTREX) 500 MG tablet Take 500 mg by mouth as needed.    [provider]    Family History Family History  Problem Relation Age of Onset  . Diabetes Maternal  Grandmother   . Cancer Maternal Grandfather   . Cancer Father   . Diabetes Mother     Social History Social History   Tobacco Use  . Smoking status: Current Every Day Smoker    Packs/day: 0.50    Years: 20.00    Pack years: 10.00    Types: Cigarettes  . Smokeless tobacco: Never Used  Vaping Use  . Vaping Use: Never assessed  Substance Use Topics  . Alcohol use: No  . Drug use: No     Allergies   Patient has no known allergies.   Review of Systems Review of Systems   Physical Exam Triage Vital Signs ED Triage Vitals  Enc Vitals Group     BP --      Pulse Rate 05/31/20 0835 84     Resp 05/31/20 0835 17     Temp 05/31/20 0835 98.2 F (36.8 C)     Temp Source 05/31/20 0835 Oral     SpO2 05/31/20 0835 100 %     Weight --      Height --      Head Circumference --      Peak Flow --      Pain Score 05/31/20 0831 8     Pain Loc --      Pain Edu? --      Excl. in GC? --    No data  found.  Updated Vital Signs Pulse 84   Temp 98.2 F (36.8 C) (Oral)   Resp 17   LMP 05/13/2020   SpO2 100%   Visual Acuity Right Eye Distance:   Left Eye Distance:   Bilateral Distance:    Right Eye Near:   Left Eye Near:    Bilateral Near:     Physical Exam Vitals and nursing note reviewed.  Constitutional:      General: She is not in acute distress.    Appearance: She is well-developed. She is not ill-appearing.  HENT:     Head: Normocephalic and atraumatic.  Eyes:     Conjunctiva/sclera: Conjunctivae normal.  Cardiovascular:     Rate and Rhythm: Normal rate and regular rhythm.     Heart sounds: No murmur heard.   Pulmonary:     Effort: Pulmonary effort is normal. No respiratory distress.     Breath sounds: Normal breath sounds. No decreased breath sounds, wheezing, rhonchi or rales.  Chest:     Chest wall: Tenderness (Tenderness to palpation of the ribs starting from below the left breast spreading laterally to the flank to the spine..  Pain elicited with palpation of the same patient felt by patient.) present.  Abdominal:     Palpations: Abdomen is soft.     Tenderness: There is no abdominal tenderness.  Musculoskeletal:     Cervical back: Neck supple.     Comments: Able to elicit pain in the left chest and ribs With movement of the left upper extremity and bending and flexing  Skin:    General: Skin is warm and dry.     Findings: No rash.  Neurological:     Mental Status: She is alert.      UC Treatments / Results  Labs (all labs ordered are listed, but only abnormal results are displayed) Labs Reviewed - No data to display  EKG Normal sinus rhythm with sinus arrhythmia.  Normal EKG  Radiology No results found.  Procedures Procedures (including critical care time)  Medications Ordered in UC Medications - No data  to display  Initial Impression / Assessment and Plan / UC Course  I have reviewed the triage vital signs and the nursing  notes.  Pertinent labs & imaging results that were available during my care of the patient were reviewed by me and considered in my medical decision making (see chart for details).     #Costochondritis Patient is a 44 year old presenting with what appears to be costochondritis.  EKG normal.  Reproducible pain.  Feel this is costochondritis.  Will treat with NSAIDs and light muscle relaxer.  Encouraged follow-up with primary care next week.  Discussed return, follow-up and emergency department precautions.  Patient verbalized understanding plan of care Final Clinical Impressions(s) / UC Diagnoses   Final diagnoses:  Costochondritis     Discharge Instructions     Start taking the Mobic once a day, you may increase this to twice a day with meals Apply the gel to area of pain  Take the muscle relaxer tizanidine/Zanaflex only at night, this will make you sleepy do not drive drink alcohol or operate machinery within 8 hours of taking  Try not to wear tight fitting bras across the area of pain  If pain were to change radiate more into your chest you have shortness of breath, vomiting or sweating go to the emergency department  Follow-up with your primary care next week.      ED Prescriptions    Medication Sig Dispense Auth. Provider   meloxicam (MOBIC) 7.5 MG tablet Take 1 tablet (7.5 mg total) by mouth 2 (two) times daily after a meal for 7 days. 14 tablet Gram Siedlecki, Veryl Speak, PA-C   tiZANidine (ZANAFLEX) 4 MG tablet Take 1 tablet (4 mg total) by mouth at bedtime for 7 days. 7 tablet Akili Corsetti, Veryl Speak, PA-C   diclofenac Sodium (VOLTAREN) 1 % GEL Apply 4 g topically 4 (four) times daily. 100 g Monaca Wadas, Veryl Speak, PA-C     PDMP not reviewed this encounter.   Hermelinda Medicus, PA-C 05/31/20 2303

## 2020-07-23 DIAGNOSIS — E1165 Type 2 diabetes mellitus with hyperglycemia: Secondary | ICD-10-CM | POA: Diagnosis not present

## 2020-07-23 DIAGNOSIS — K219 Gastro-esophageal reflux disease without esophagitis: Secondary | ICD-10-CM | POA: Diagnosis not present

## 2020-07-23 DIAGNOSIS — I10 Essential (primary) hypertension: Secondary | ICD-10-CM | POA: Diagnosis not present

## 2021-01-15 DIAGNOSIS — K219 Gastro-esophageal reflux disease without esophagitis: Secondary | ICD-10-CM | POA: Diagnosis not present

## 2021-01-15 DIAGNOSIS — E785 Hyperlipidemia, unspecified: Secondary | ICD-10-CM | POA: Diagnosis not present

## 2021-01-15 DIAGNOSIS — I10 Essential (primary) hypertension: Secondary | ICD-10-CM | POA: Diagnosis not present

## 2021-01-15 DIAGNOSIS — E1165 Type 2 diabetes mellitus with hyperglycemia: Secondary | ICD-10-CM | POA: Diagnosis not present

## 2021-01-30 DIAGNOSIS — B009 Herpesviral infection, unspecified: Secondary | ICD-10-CM | POA: Diagnosis not present

## 2021-01-30 DIAGNOSIS — Z1239 Encounter for other screening for malignant neoplasm of breast: Secondary | ICD-10-CM | POA: Diagnosis not present

## 2021-01-30 DIAGNOSIS — Z01419 Encounter for gynecological examination (general) (routine) without abnormal findings: Secondary | ICD-10-CM | POA: Diagnosis not present

## 2021-01-30 DIAGNOSIS — Z1231 Encounter for screening mammogram for malignant neoplasm of breast: Secondary | ICD-10-CM | POA: Diagnosis not present

## 2021-03-31 ENCOUNTER — Ambulatory Visit (INDEPENDENT_AMBULATORY_CARE_PROVIDER_SITE_OTHER): Payer: BC Managed Care – PPO

## 2021-03-31 ENCOUNTER — Ambulatory Visit (HOSPITAL_COMMUNITY)
Admission: EM | Admit: 2021-03-31 | Discharge: 2021-03-31 | Disposition: A | Discharge status: Home / Self Care | Payer: BC Managed Care – PPO | Attending: Physician Assistant | Admitting: Physician Assistant

## 2021-03-31 ENCOUNTER — Other Ambulatory Visit: Payer: Self-pay

## 2021-03-31 ENCOUNTER — Encounter (HOSPITAL_COMMUNITY): Payer: Self-pay | Admitting: *Deleted

## 2021-03-31 DIAGNOSIS — R079 Chest pain, unspecified: Secondary | ICD-10-CM

## 2021-03-31 DIAGNOSIS — R0781 Pleurodynia: Secondary | ICD-10-CM | POA: Diagnosis not present

## 2021-03-31 DIAGNOSIS — R0782 Intercostal pain: Secondary | ICD-10-CM | POA: Diagnosis not present

## 2021-03-31 DIAGNOSIS — M940 Chondrocostal junction syndrome [Tietze]: Secondary | ICD-10-CM | POA: Diagnosis not present

## 2021-03-31 DIAGNOSIS — N644 Mastodynia: Secondary | ICD-10-CM | POA: Diagnosis not present

## 2021-03-31 MED ORDER — TIZANIDINE HCL 4 MG PO CAPS: 4.0000 mg | ORAL_CAPSULE | Freq: Every evening | ORAL | 0 refills | Status: DC | PRN

## 2021-03-31 MED ORDER — NAPROXEN 375 MG PO TABS: 375.0000 mg | ORAL_TABLET | Freq: Two times a day (BID) | ORAL | 0 refills | Status: DC

## 2021-03-31 NOTE — ED Provider Notes (Signed)
[facility]    CSN: [id number] Arrival date & time: 03/31/21  1047      History   Chief Complaint Chief Complaint  Patient presents with   rib pain Lt    HPI MCCARTNEY BRUCKS is a 45 y.o. female.   Patient presents today with a 3-day history of persistent left rib pain.  She denies any known injury or increase in activity prior to symptom onset.  She denies any recent illness or recurrent coughing; does have a chronic cough related to smoking but states that baseline.  Pain is rated 9 on a 0-10 pain scale, localized to left ribs with radiation laterally, described as intense aching with periodic sharp pains, worse with deep breathing or movement, no alleviating factors identified.  She has tried high-dose ibuprofen without improvement of symptoms.  She does have a history of costochondritis and states current symptoms are similar to previous episodes of this condition.  She denies any chest pressure/pain, shortness of breath, diaphoresis, nausea, vomiting, lightheadedness.  She denies any rash or history of herpes zoster.   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   Paroxysmal atrial fibrillation [facility])     Patient Active Problem List   Diagnosis Date Noted   Status post laparoscopy 02/14/2014   Paroxysmal atrial fibrillation (HCC) 02/03/2014   Ovarian cyst, left 12/13/2013   Metrorrhagia 10/11/2012    Past Surgical History:  Procedure Laterality Date   CESAREAN SECTION     2 times    LAPAROSCOPIC BILATERAL SALPINGECTOMY Bilateral 02/14/2014   Procedure: LAPAROSCOPIC BILATERAL SALPINGECTOMY with cervical laceration repair;  Surgeon: Michael Litter, MD;  Location: [facility];  Service: Gynecology;  Laterality: Bilateral;   LAPAROSCOPIC LYSIS OF ADHESIONS N/A 02/14/2014   Procedure: LAPAROSCOPIC LYSIS OF ADHESIONS;  Surgeon: Michael Litter, MD;   Location: [facility];  Service: Gynecology;  Laterality: N/A;   LAPAROSCOPY N/A 02/14/2014   Procedure: LAPAROSCOPY OPERATIVE ;  Surgeon: Michael Litter, MD;  Location: [facility];  Service: Gynecology;  Laterality: N/A;   TUBAL LIGATION      OB History     Gravida  2   Para  2   Term  2   Preterm      AB      Living  2      SAB      IAB      Ectopic      Multiple      Live Births               Home Medications    Prior to Admission medications   Medication Sig Start Date End Date Taking? Authorizing Provider  naproxen (NAPROSYN) 375 MG tablet Take 1 tablet (375 mg total) by mouth 2 (two) times daily. 03/31/21  Yes Yeny Schmoll K, PA-C  tiZANidine (ZANAFLEX) 4 MG capsule Take 1 capsule (4 mg total) by mouth at bedtime as needed for muscle spasms. 03/31/21  Yes Ceylin Dreibelbis, Noberto Retort, PA-C  ertugliflozin L-PyroglutamicAc (STEGLATRO) 15 MG TABS tablet Take 15 mg by mouth daily.    [provider]    Family History Family History  Problem Relation Age of Onset   Diabetes Maternal Grandmother    Cancer Maternal Grandfather    Cancer Father    Diabetes Mother     Social History Social History  Tobacco Use   Smoking status: Every Day    Packs/day: 0.50    Years: 20.00    Pack years: 10.00    Types: Cigarettes   Smokeless tobacco: Never  Substance Use Topics   Alcohol use: No   Drug use: No     Allergies   Patient has no known allergies.   Review of Systems Review of Systems  Constitutional:  Positive for activity change. Negative for appetite change, fatigue and fever.  Respiratory:  Negative for cough and shortness of breath.   Cardiovascular:  Positive for chest pain (left chest wall). Negative for palpitations.  Gastrointestinal:  Negative for abdominal pain, diarrhea, nausea and vomiting.  Musculoskeletal:  Negative for arthralgias, back pain and myalgias.  Neurological:  Negative for dizziness, light-headedness and headaches.    Physical  Exam Triage Vital Signs ED Triage Vitals [03/31/21 1114]  Enc Vitals Group     BP 118/76     Pulse Rate 85     Resp 18     Temp 98.8 F (37.1 C)     Temp Source Oral     SpO2 99 %     Weight      Height      Head Circumference      Peak Flow      Pain Score 9     Pain Loc      Pain Edu?      Excl. in GC?    No data found.  Updated Vital Signs BP 118/76   Pulse 85   Temp 98.8 F (37.1 C) (Oral)   Resp 18   LMP 03/31/2021   SpO2 99%   Visual Acuity Right Eye Distance:   Left Eye Distance:   Bilateral Distance:    Right Eye Near:   Left Eye Near:    Bilateral Near:     Physical Exam Vitals reviewed.  Constitutional:      General: She is awake. She is not in acute distress.    Appearance: Normal appearance. She is normal weight. She is not ill-appearing.     Comments: Very pleasant female appears stated age in no acute distress sitting comfortably in exam room  HENT:     Head: Normocephalic and atraumatic.  Cardiovascular:     Rate and Rhythm: Normal rate and regular rhythm.     Heart sounds: Normal heart sounds, S1 normal and S2 normal. No murmur heard. Pulmonary:     Effort: Pulmonary effort is normal.     Breath sounds: Normal breath sounds. No wheezing, rhonchi or rales.     Comments: Clear to auscultation bilaterally Chest:     Chest wall: Tenderness present. No deformity or swelling.     Comments: Tenderness to palpation of left anterior lower ribs.  No deformity noted. Abdominal:     Palpations: Abdomen is soft.     Tenderness: There is no abdominal tenderness.  Musculoskeletal:     Right lower leg: No edema.     Left lower leg: No edema.  Skin:    Comments: No rash on exam  Psychiatric:        Behavior: Behavior is cooperative.     UC Treatments / Results  Labs (all labs ordered are listed, but only abnormal results are displayed) Labs Reviewed - No data to display  EKG   Radiology DG Ribs Unilateral W/Chest Left  Result Date:  03/31/2021 CLINICAL DATA:  Point tenderness along LEFT ribs. No known injury. Pain under the  LEFT breast for 4 days. EXAM: LEFT RIBS AND CHEST - 3+ VIEW COMPARISON:  01/28/2014 FINDINGS: Heart size is normal. Lungs are clear. No pulmonary edema or pneumothorax. No pleural effusion. Oblique views of the ribs show no acute displaced fractures. IMPRESSION: No evidence for acute  abnormality. Electronically Signed   By: Norva Pavlov M.D.   On: 03/31/2021 12:14    Procedures Procedures (including critical care time)  Medications Ordered in UC Medications - No data to display  Initial Impression / Assessment and Plan / UC Course  I have reviewed the triage vital signs and the nursing notes.  Pertinent labs & imaging results that were available during my care of the patient were reviewed by me and considered in my medical decision making (see chart for details).      X-ray obtained showed no acute fractures.  Suspect costochondritis given reproducible pain on exam.  Patient started on Naprosyn with instruction not to take additional NSAIDs.  She was prescribed tizanidine to be used at night with instruction not to drive or drink alcohol with this.  Recommended conservative treatment measures including heat and rest.  Discussed alarm symptoms that warrant emergent evaluation.  Strict return precautions given to which patient expressed understanding.  Final Clinical Impressions(s) / UC Diagnoses   Final diagnoses:  Rib pain on left side  Costochondritis     Discharge Instructions      Your x-ray was normal.  I suspect you have costochondritis given the tenderness only pressure on your rib cage.  Take Naprosyn twice daily.  You should not take additional NSAIDs including aspirin, ibuprofen/Advil, naproxen/Aleve with this medication as it can cause stomach bleeding.  You can use tizanidine at night to help with sleep and pain.  You should not drive or drink alcohol with this medication as  drowsiness is a common side effect.  Use heat on affected area.  If you develop any worsening symptoms please return for reevaluation.     ED Prescriptions     Medication Sig Dispense Auth. Provider   naproxen (NAPROSYN) 375 MG tablet Take 1 tablet (375 mg total) by mouth 2 (two) times daily. 20 tablet Mariabelen Pressly K, PA-C   tiZANidine (ZANAFLEX) 4 MG capsule Take 1 capsule (4 mg total) by mouth at bedtime as needed for muscle spasms. 10 capsule Kerim Statzer, Noberto Retort, PA-C      PDMP not reviewed this encounter.   Jeani Hawking, PA-C 03/31/21 1248

## 2021-03-31 NOTE — ED Triage Notes (Signed)
Pt reports Lt sided rib pain hurts when touched since Thursday.

## 2021-03-31 NOTE — Discharge Instructions (Addendum)
Your x-ray was normal.  I suspect you have costochondritis given the tenderness only pressure on your rib cage.  Take Naprosyn twice daily.  You should not take additional NSAIDs including aspirin, ibuprofen/Advil, naproxen/Aleve with this medication as it can cause stomach bleeding.  You can use tizanidine at night to help with sleep and pain.  You should not drive or drink alcohol with this medication as drowsiness is a common side effect.  Use heat on affected area.  If you develop any worsening symptoms please return for reevaluation.

## 2021-05-21 DIAGNOSIS — R7309 Other abnormal glucose: Secondary | ICD-10-CM | POA: Diagnosis not present

## 2021-05-21 DIAGNOSIS — H31093 Other chorioretinal scars, bilateral: Secondary | ICD-10-CM | POA: Diagnosis not present

## 2021-05-21 DIAGNOSIS — H2513 Age-related nuclear cataract, bilateral: Secondary | ICD-10-CM | POA: Diagnosis not present

## 2021-07-11 ENCOUNTER — Ambulatory Visit (INDEPENDENT_AMBULATORY_CARE_PROVIDER_SITE_OTHER): Payer: BC Managed Care – PPO

## 2021-07-11 ENCOUNTER — Encounter (HOSPITAL_COMMUNITY): Payer: Self-pay | Admitting: Emergency Medicine

## 2021-07-11 ENCOUNTER — Ambulatory Visit (HOSPITAL_COMMUNITY)
Admission: EM | Admit: 2021-07-11 | Discharge: 2021-07-11 | Disposition: A | Discharge status: Home / Self Care | Payer: BC Managed Care – PPO | Attending: Emergency Medicine | Admitting: Emergency Medicine

## 2021-07-11 ENCOUNTER — Other Ambulatory Visit: Payer: Self-pay

## 2021-07-11 DIAGNOSIS — J069 Acute upper respiratory infection, unspecified: Secondary | ICD-10-CM | POA: Diagnosis not present

## 2021-07-11 DIAGNOSIS — R059 Cough, unspecified: Secondary | ICD-10-CM | POA: Diagnosis not present

## 2021-07-11 DIAGNOSIS — J209 Acute bronchitis, unspecified: Secondary | ICD-10-CM | POA: Diagnosis not present

## 2021-07-11 DIAGNOSIS — R0989 Other specified symptoms and signs involving the circulatory and respiratory systems: Secondary | ICD-10-CM | POA: Diagnosis not present

## 2021-07-11 MED ORDER — FLUTICASONE PROPIONATE 50 MCG/ACT NA SUSP: 2.0000 | Freq: Every day | NASAL | 0 refills | Status: AC

## 2021-07-11 MED ORDER — ALBUTEROL SULFATE HFA 108 (90 BASE) MCG/ACT IN AERS: 1.0000 | INHALATION_SPRAY | RESPIRATORY_TRACT | 0 refills | Status: AC | PRN

## 2021-07-11 MED ORDER — IBUPROFEN 600 MG PO TABS: 600.0000 mg | ORAL_TABLET | Freq: Four times a day (QID) | ORAL | 0 refills | Status: DC | PRN

## 2021-07-11 MED ORDER — AEROCHAMBER PLUS MISC: 2 refills | Status: DC

## 2021-07-11 MED ORDER — BENZONATATE 200 MG PO CAPS: 200.0000 mg | ORAL_CAPSULE | Freq: Three times a day (TID) | ORAL | 0 refills | Status: DC | PRN

## 2021-07-11 MED ORDER — PREDNISONE 20 MG PO TABS: 40.0000 mg | ORAL_TABLET | Freq: Every day | ORAL | 0 refills | Status: AC

## 2021-07-11 NOTE — ED Provider Notes (Signed)
HPI  SUBJECTIVE:  Wendy Ramsey is a 45 y.o. female who presents with 1 week of nasal congestion, chest congestion, yellow rhinorrhea, wheezing, postnasal drip.  She reports headache yesterday secondary to cough.  She reports chest tightness "sometimes".  She had sinus pain and pressure at the beginning of the illness, but this has since resolved.  No fevers, body aches, chest pain, loss of sense of smell or taste, nausea, vomiting, diarrhea, abdominal pain, upper dental pain, facial swelling.  She states that her nasal congestion got better, but then her chest congestion got worse.  No known COVID exposure.  She got the COVID booster. She has tried Tussin DM with temporary improvement in her symptoms.  She has also tried ibuprofen 200 mg which helps her headache.  Cough is worse with lying down and being hot.  States she is unable to sleep at night secondary to the cough.  No antibiotics in the past 3 months.  No antipyretic in the past 6 hours..  She has a past medical history of well-controlled diabetes, paroxysmal atrial fibrillation.  She was discontinued from medications by cardiology "a long time ago".  She is a smoker.  No history of hypertension, pulmonary disease, COVID.  LMP: The middle of last month.  Denies possibility being pregnant.  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   Paroxysmal atrial fibrillation [facility])     Past Surgical History:  Procedure Laterality Date   CESAREAN SECTION     2 times    LAPAROSCOPIC BILATERAL SALPINGECTOMY Bilateral 02/14/2014   Procedure: LAPAROSCOPIC BILATERAL SALPINGECTOMY with cervical laceration repair;  Surgeon: Michael Litter, MD;  Location: [facility];  Service: Gynecology;  Laterality: Bilateral;   LAPAROSCOPIC LYSIS OF ADHESIONS N/A 02/14/2014   Procedure: LAPAROSCOPIC LYSIS OF ADHESIONS;  Surgeon:  Michael Litter, MD;  Location: [facility];  Service: Gynecology;  Laterality: N/A;   LAPAROSCOPY N/A 02/14/2014   Procedure: LAPAROSCOPY OPERATIVE ;  Surgeon: Michael Litter, MD;  Location: [facility];  Service: Gynecology;  Laterality: N/A;   TUBAL LIGATION      Family History  Problem Relation Age of Onset   Diabetes Maternal Grandmother    Cancer Maternal Grandfather    Cancer Father    Diabetes Mother     Social History   Tobacco Use   Smoking status: Every Day    Packs/day: 0.50    Years: 20.00    Pack years: 10.00    Types: Cigarettes   Smokeless tobacco: Never  Substance Use Topics   Alcohol use: No   Drug use: No    No current facility-administered medications for this encounter.  Current Outpatient Medications:    albuterol (VENTOLIN HFA) 108 (90 Base) MCG/ACT inhaler, Inhale 1-2 puffs into the lungs every 4 (four) hours as needed for wheezing or shortness of breath., Disp: 1 each, Rfl: 0   benzonatate (TESSALON) 200 MG capsule, Take 1 capsule (200 mg total) by mouth 3 (three) times daily as needed for cough., Disp: 30 capsule, Rfl: 0   fluticasone (FLONASE) 50 MCG/ACT nasal spray, Place 2 sprays into both nostrils daily., Disp: 16 g, Rfl: 0   ibuprofen (ADVIL) 600 MG tablet, Take 1 tablet (600 mg total) by mouth every 6 (six) hours as needed., Disp: 30 tablet, Rfl: 0   predniSONE (DELTASONE)  20 MG tablet, Take 2 tablets (40 mg total) by mouth daily with breakfast for 5 days., Disp: 10 tablet, Rfl: 0   Spacer/Aero-Holding Chambers (AEROCHAMBER PLUS) inhaler, Use with inhaler, Disp: 1 each, Rfl: 2   ertugliflozin L-PyroglutamicAc (STEGLATRO) 15 MG TABS tablet, Take 15 mg by mouth daily., Disp: , Rfl:    metFORMIN (GLUCOPHAGE) 500 MG tablet, metformin 500 mg tablet  TAKE 1 TABLET BY MOUTH EVERY DAY, Disp: , Rfl:   Facility-Administered Medications Ordered in Other Encounters:    LORazepam (ATIVAN) injection 1 mg, 1 mg, Intravenous, Once, Mal Amabile, MD  No Known  Allergies   ROS  As noted in HPI.   Physical Exam  BP 116/70 (BP Location: Left Arm)   Pulse 83   Temp 97.8 F (36.6 C) (Oral)   Resp 17   LMP 06/14/2021   SpO2 98%   Constitutional: Well developed, well nourished, in no distress.  Coughing. Eyes:  EOMI, conjunctiva normal bilaterally HENT: Normocephalic, atraumatic,mucus membranes moist.  Positive nasal congestion.  Erythematous, swollen turbinates.  No maxillary, frontal sinus tenderness.  Positive cobblestoning. Respiratory: Normal inspiratory effort, rhonchi bilateral upper lobes and in right lower lobe.  Positive anterior chest wall tenderness Cardiovascular: Normal rate, regular rhythm, no murmurs rubs or gallops GI: nondistended skin: No rash, skin intact Musculoskeletal: no deformities Neurologic: Alert & oriented x 3, no focal neuro deficits Psychiatric: Speech and behavior appropriate   ED Course   Medications - No data to display  Orders Placed This Encounter  Procedures   DG Chest 2 View    Standing Status:   Standing    Number of Occurrences:   1    Order Specific Question:   Reason for Exam (SYMPTOM  OR DIAGNOSIS REQUIRED)    Answer:   Cough, rhonchi bilateral upper lobes, right lower lobe rule out pneumonia    No results found for this or any previous visit (from the past 24 hour(s)). DG Chest 2 View  Result Date: 07/11/2021 CLINICAL DATA:  Cough, rhonchi bilateral upper lobes, right lower lobe, rule out pneumonia EXAM: CHEST - 2 VIEW COMPARISON:  03/31/2021 FINDINGS: Similar interstitial changes are likely chronic. No new consolidation or edema. No pleural effusion. No acute osseous abnormality. Stable cardiomediastinal contours with normal heart size. IMPRESSION: No acute process in the chest. Electronically Signed   By: Guadlupe Spanish M.D.   On: 07/11/2021 12:06    ED Clinical Impression  1. Acute bronchitis, unspecified organism   2. Viral URI with cough      ED Assessment/Plan  Patient with  a bronchitis versus pneumonia.  Will obtain chest x-ray.    Reviewed imaging independently.  No pneumonia.  See radiology report for full details.  Presentation consistent with a bronchitis.  Plan to send home with an albuterol inhaler with a spacer, Flonase, Mucinex, Tessalon, advised honey/lemon tea. prednisone 40 mg for 5 days.  Patient states diabetes is well controlled.  Advised her that this will elevate her sugars.  Follow-up with PMD or here in 5 days if not getting any better, and we can consider antibiotics at that time, to the ER if she gets worse.  Discussed imaging, MDM, treatment plan, and plan for follow-up with patient. Discussed sn/sx that should prompt return to the ED. patient agrees with plan.   Meds ordered this encounter  Medications   albuterol (VENTOLIN HFA) 108 (90 Base) MCG/ACT inhaler    Sig: Inhale 1-2 puffs into the lungs every 4 (four) hours as  needed for wheezing or shortness of breath.    Dispense:  1 each    Refill:  0   fluticasone (FLONASE) 50 MCG/ACT nasal spray    Sig: Place 2 sprays into both nostrils daily.    Dispense:  16 g    Refill:  0   Spacer/Aero-Holding Chambers (AEROCHAMBER PLUS) inhaler    Sig: Use with inhaler    Dispense:  1 each    Refill:  2    Please educate patient on use   benzonatate (TESSALON) 200 MG capsule    Sig: Take 1 capsule (200 mg total) by mouth 3 (three) times daily as needed for cough.    Dispense:  30 capsule    Refill:  0   ibuprofen (ADVIL) 600 MG tablet    Sig: Take 1 tablet (600 mg total) by mouth every 6 (six) hours as needed.    Dispense:  30 tablet    Refill:  0   predniSONE (DELTASONE) 20 MG tablet    Sig: Take 2 tablets (40 mg total) by mouth daily with breakfast for 5 days.    Dispense:  10 tablet    Refill:  0       *This clinic note was created using Scientist, clinical (histocompatibility and immunogenetics). Therefore, there may be occasional mistakes despite careful proofreading.  ?    Domenick Gong, MD 07/11/21  1221

## 2021-07-11 NOTE — ED Triage Notes (Signed)
Pt presents with chest congestion and cough xs 1 week. States has tried multiple OTC medications with no relief.

## 2021-07-11 NOTE — Discharge Instructions (Addendum)
Fortunately, there is no cheaper alternative to the albuterol inhaler unless I give you a nebulizer solution, then you would have to get a machine to use it.  Overall it will be cheaper to do the albuterol inhaler.  2 puffs from your albuterol inhaler every 4-6 hours using your spacer as needed for wheezing, coughing, shortness of breath.  The steroids will also help.  They will elevate your sugars though.  Go to the ER if your glucose meter reads "high". Flonase, Mucinex, saline nasal irrigation with a Lloyd Huger Med rinse and distilled water as often you want for the nasal congestion.  Tessalon for the cough.  Honey and lemon tea have also been shown to be very beneficial for cough.  Go to www.goodrx.com to find the pharmacy which will give you the best price on your medications.  You can also ask them what the good Rx price is and they will give you the discount.

## 2021-07-23 DIAGNOSIS — E1169 Type 2 diabetes mellitus with other specified complication: Secondary | ICD-10-CM | POA: Diagnosis not present

## 2021-07-23 DIAGNOSIS — Z23 Encounter for immunization: Secondary | ICD-10-CM | POA: Diagnosis not present

## 2021-07-23 DIAGNOSIS — F17200 Nicotine dependence, unspecified, uncomplicated: Secondary | ICD-10-CM | POA: Diagnosis not present

## 2021-07-23 DIAGNOSIS — I10 Essential (primary) hypertension: Secondary | ICD-10-CM | POA: Diagnosis not present

## 2021-08-20 DIAGNOSIS — E1169 Type 2 diabetes mellitus with other specified complication: Secondary | ICD-10-CM | POA: Diagnosis not present

## 2021-11-24 ENCOUNTER — Other Ambulatory Visit: Payer: Self-pay

## 2021-11-24 ENCOUNTER — Encounter (HOSPITAL_COMMUNITY): Payer: Self-pay

## 2021-11-24 ENCOUNTER — Ambulatory Visit (HOSPITAL_COMMUNITY)
Admission: EM | Admit: 2021-11-24 | Discharge: 2021-11-24 | Disposition: A | Discharge status: Home / Self Care | Payer: BC Managed Care – PPO

## 2021-11-24 DIAGNOSIS — H6591 Unspecified nonsuppurative otitis media, right ear: Secondary | ICD-10-CM

## 2021-11-24 DIAGNOSIS — H9201 Otalgia, right ear: Secondary | ICD-10-CM

## 2021-11-24 NOTE — ED Triage Notes (Signed)
Pt presents for right side ear pain and pressure x 2-3 days.

## 2021-11-24 NOTE — ED Provider Notes (Signed)
[facility]    CSN: [id number] Arrival date & time: 11/24/21  1047      History   Chief Complaint No chief complaint on file.   HPI Wendy Ramsey is a 46 y.o. female.   Patient reports 4-day history of right ear pain and pressure.  She reports prior to the ear pain started, she did have some congestion, runny nose, postnasal drip.  She reports her symptoms have resolved.  She denies any drainage from the ear, denies any fevers, body aches, chills.  She denies any changes in her appetite, nausea, vomiting, or diarrhea.  She does feel more tired than normal.  She has tried taking Sudafed without relief.  She also reports she takes allergy medication and started couple of days ago.     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   Paroxysmal atrial fibrillation [facility])     Patient Active Problem List   Diagnosis Date Noted   Status post laparoscopy 02/14/2014   Paroxysmal atrial fibrillation (HCC) 02/03/2014   Ovarian cyst, left 12/13/2013   Metrorrhagia 10/11/2012    Past Surgical History:  Procedure Laterality Date   CESAREAN SECTION     2 times    LAPAROSCOPIC BILATERAL SALPINGECTOMY Bilateral 02/14/2014   Procedure: LAPAROSCOPIC BILATERAL SALPINGECTOMY with cervical laceration repair;  Surgeon: Michael Litter, MD;  Location: [facility];  Service: Gynecology;  Laterality: Bilateral;   LAPAROSCOPIC LYSIS OF ADHESIONS N/A 02/14/2014   Procedure: LAPAROSCOPIC LYSIS OF ADHESIONS;  Surgeon: Michael Litter, MD;  Location: [facility];  Service: Gynecology;  Laterality: N/A;   LAPAROSCOPY N/A 02/14/2014   Procedure: LAPAROSCOPY OPERATIVE ;  Surgeon: Michael Litter, MD;  Location: [facility];  Service: Gynecology;  Laterality: N/A;   TUBAL LIGATION      OB History     Gravida  2   Para  2   Term  2   Preterm      AB      Living  2      SAB       IAB      Ectopic      Multiple      Live Births               Home Medications    Prior to Admission medications   Medication Sig Start Date End Date Taking? Authorizing Provider  albuterol (VENTOLIN HFA) 108 (90 Base) MCG/ACT inhaler Inhale 1-2 puffs into the lungs every 4 (four) hours as needed for wheezing or shortness of breath. 07/11/21   Domenick Gong, MD  benzonatate (TESSALON) 200 MG capsule Take 1 capsule (200 mg total) by mouth 3 (three) times daily as needed for cough. 07/11/21   Domenick Gong, MD  ertugliflozin L-PyroglutamicAc (STEGLATRO) 15 MG TABS tablet Take 15 mg by mouth daily.    [provider]  fluticasone (FLONASE) 50 MCG/ACT nasal spray Place 2 sprays into both nostrils daily. 07/11/21   Domenick Gong, MD  metFORMIN (GLUCOPHAGE) 500 MG tablet metformin 500 mg tablet  TAKE 1 TABLET BY MOUTH EVERY DAY    [provider]  Spacer/Aero-Holding Chambers (AEROCHAMBER PLUS) inhaler Use with inhaler 07/11/21   Domenick Gong, MD    Family History Family History  Problem Relation Age of Onset   Diabetes Maternal Grandmother    Cancer Maternal Grandfather  Cancer Father    Diabetes Mother     Social History Social History   Tobacco Use   Smoking status: Every Day    Packs/day: 0.50    Years: 20.00    Pack years: 10.00    Types: Cigarettes   Smokeless tobacco: Never  Substance Use Topics   Alcohol use: No   Drug use: No     Allergies   Patient has no known allergies.   Review of Systems Review of Systems Per HPI  Physical Exam Triage Vital Signs ED Triage Vitals [11/24/21 1152]  Enc Vitals Group     BP 106/75     Pulse Rate 87     Resp 16     Temp 98.1 F (36.7 C)     Temp Source Oral     SpO2 100 %     Weight      Height      Head Circumference      Peak Flow      Pain Score      Pain Loc      Pain Edu?      Excl. in GC?    No data found.  Updated Vital Signs BP 106/75 (BP Location:  Left Arm)    Pulse 87    Temp 98.1 F (36.7 C) (Oral)    Resp 16    LMP 11/22/2021    SpO2 100%   Visual Acuity Right Eye Distance:   Left Eye Distance:   Bilateral Distance:    Right Eye Near:   Left Eye Near:    Bilateral Near:     Physical Exam Vitals and nursing note reviewed.  Constitutional:      General: She is not in acute distress.    Appearance: Normal appearance. She is not toxic-appearing.  HENT:     Head: Normocephalic and atraumatic.     Right Ear: Ear canal and external ear normal. No drainage or swelling. A middle ear effusion is present.     Left Ear: Tympanic membrane, ear canal and external ear normal. No drainage or swelling.  No middle ear effusion.     Nose: Nose normal. No congestion or rhinorrhea.     Mouth/Throat:     Mouth: Mucous membranes are moist.     Pharynx: Oropharynx is clear. Posterior oropharyngeal erythema present.  Eyes:     General: No scleral icterus.    Extraocular Movements: Extraocular movements intact.  Cardiovascular:     Rate and Rhythm: Normal rate and regular rhythm.     Heart sounds: Normal heart sounds. No murmur heard. Pulmonary:     Effort: Pulmonary effort is normal. No respiratory distress.     Breath sounds: Wheezing present. No rhonchi or rales.  Musculoskeletal:     Cervical back: Normal range of motion and neck supple.  Lymphadenopathy:     Cervical: No cervical adenopathy.  Skin:    General: Skin is warm and dry.     Capillary Refill: Capillary refill takes less than 2 seconds.     Coloration: Skin is not jaundiced or pale.     Findings: No erythema.  Neurological:     Mental Status: She is alert and oriented to person, place, and time.     UC Treatments / Results  Labs (all labs ordered are listed, but only abnormal results are displayed) Labs Reviewed - No data to display  EKG   Radiology No results found.  Procedures Procedures (including critical care time)  Medications Ordered in  UC Medications - No data to display  Initial Impression / Assessment and Plan / UC Course  I have reviewed the triage vital signs and the nursing notes.  Pertinent labs & imaging results that were available during my care of the patient were reviewed by me and considered in my medical decision making (see chart for details).    Examination today is unremarkable for any redness or erythema on her right tympanic membrane.  She does have some posterior oropharynx erythema and slight effusion to right ear, suspect allergy related component.  Encouraged starting daily antihistamine and daily intranasal corticosteroid.  Follow-up with primary care provider if symptoms do not improve with treatment.  Final Clinical Impressions(s) / UC Diagnoses   Final diagnoses:  Right ear pain  MEE (middle ear effusion), right     Discharge Instructions      Please make sure you are taking a daily antihistamine like Claritin and nasal steroid like Flonase to help with the ear pain symptoms.  Follow up with your primary care provider if your symptoms do not improve.     ED Prescriptions   None    PDMP not reviewed this encounter.   Valentino Nose, NP 11/24/21 1214

## 2021-11-24 NOTE — Discharge Instructions (Addendum)
Please make sure you are taking a daily antihistamine like Claritin and nasal steroid like Flonase to help with the ear pain symptoms.  Follow up with your primary care provider if your symptoms do not improve.

## 2022-07-01 ENCOUNTER — Other Ambulatory Visit: Payer: Self-pay | Admitting: Nurse Practitioner

## 2022-07-01 ENCOUNTER — Ambulatory Visit
Admission: RE | Admit: 2022-07-01 | Discharge: 2022-07-01 | Disposition: A | Discharge status: Home / Self Care | Payer: No Typology Code available for payment source | Source: Ambulatory Visit | Attending: Nurse Practitioner | Admitting: Nurse Practitioner

## 2022-07-01 DIAGNOSIS — M25561 Pain in right knee: Secondary | ICD-10-CM

## 2022-07-01 DIAGNOSIS — W010XXD Fall on same level from slipping, tripping and stumbling without subsequent striking against object, subsequent encounter: Secondary | ICD-10-CM

## 2022-09-02 ENCOUNTER — Ambulatory Visit (INDEPENDENT_AMBULATORY_CARE_PROVIDER_SITE_OTHER): Payer: BC Managed Care – PPO | Admitting: Podiatry

## 2022-09-02 ENCOUNTER — Encounter: Payer: Self-pay | Admitting: Podiatry

## 2022-09-02 ENCOUNTER — Ambulatory Visit (INDEPENDENT_AMBULATORY_CARE_PROVIDER_SITE_OTHER): Payer: BC Managed Care – PPO

## 2022-09-02 VITALS — BP 120/70 | HR 90

## 2022-09-02 DIAGNOSIS — R6882 Decreased libido: Secondary | ICD-10-CM | POA: Insufficient documentation

## 2022-09-02 DIAGNOSIS — N838 Other noninflammatory disorders of ovary, fallopian tube and broad ligament: Secondary | ICD-10-CM | POA: Insufficient documentation

## 2022-09-02 DIAGNOSIS — B009 Herpesviral infection, unspecified: Secondary | ICD-10-CM | POA: Insufficient documentation

## 2022-09-02 DIAGNOSIS — M722 Plantar fascial fibromatosis: Secondary | ICD-10-CM

## 2022-09-02 DIAGNOSIS — Z720 Tobacco use: Secondary | ICD-10-CM | POA: Insufficient documentation

## 2022-09-02 MED ORDER — MELOXICAM 15 MG PO TABS: 15.0000 mg | ORAL_TABLET | Freq: Every day | ORAL | 3 refills | Status: DC

## 2022-09-02 MED ORDER — TRIAMCINOLONE ACETONIDE 40 MG/ML IJ SUSP
20.0000 mg | Freq: Once | INTRAMUSCULAR | Status: AC
  Administered 2022-09-02: 20 mg

## 2022-09-02 MED ORDER — METHYLPREDNISOLONE 4 MG PO TBPK: ORAL_TABLET | ORAL | 0 refills | Status: DC

## 2022-09-02 NOTE — Patient Instructions (Signed)

## 2022-09-02 NOTE — Progress Notes (Signed)
Subjective:  Patient ID: Wendy Ramsey, female    DOB: [birth year],  MRN: [id number] HPI Chief Complaint  Patient presents with   Foot Pain    Plantar heel right - aching x couple months, AM pain, ibuprofen   New Patient (Initial Visit)   Diabetes    Last A1c was 6.0    46 y.o. female presents with the above complaint.   ROS: Denies fever chills nausea vomit muscle aches pains calf pain back pain chest pain shortness of breath.  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   Paroxysmal atrial fibrillation [facility])    Past Surgical History:  Procedure Laterality Date   CESAREAN SECTION     2 times    LAPAROSCOPIC BILATERAL SALPINGECTOMY Bilateral 02/14/2014   Procedure: LAPAROSCOPIC BILATERAL SALPINGECTOMY with cervical laceration repair;  Surgeon: Michael Litter, MD;  Location: [facility];  Service: Gynecology;  Laterality: Bilateral;   LAPAROSCOPIC LYSIS OF ADHESIONS N/A 02/14/2014   Procedure: LAPAROSCOPIC LYSIS OF ADHESIONS;  Surgeon: Michael Litter, MD;  Location[facility];  Service: Gynecology;  Laterality: N/A;   LAPAROSCOPY N/A 02/14/2014   Procedure: LAPAROSCOPY OPERATIVE ;  Surgeon: Michael Litter, MD;  Location: [facility];  Service: Gynecology;  Laterality: N/A;   TUBAL LIGATION      Current Outpatient Medications:    meloxicam (MOBIC) 15 MG tablet, Take 1 tablet (15 mg total) by mouth daily., Disp: 30 tablet, Rfl: 3   methylPREDNISolone (MEDROL DOSEPAK) 4 MG TBPK tablet, 6 day dose pack - take as directed, Disp: 21 tablet, Rfl: 0   albuterol (VENTOLIN HFA) 108 (90 Base) MCG/ACT inhaler, Inhale 1-2 puffs into the lungs every 4 (four) hours as needed for wheezing or shortness of breath., Disp: 1 each, Rfl: 0   fluticasone (FLONASE) 50 MCG/ACT nasal spray, Place 2 sprays into both nostrils daily., Disp: 16 g, Rfl: 0   metFORMIN (GLUCOPHAGE) 500 MG tablet,  metformin 500 mg tablet  TAKE 1 TABLET BY MOUTH EVERY DAY, Disp: , Rfl:  No current facility-administered medications for this visit.  Facility-Administered Medications Ordered in Other Visits:    LORazepam (ATIVAN) injection 1 mg, 1 mg, Intravenous, Once, Mal Amabile, MD  No Known Allergies Review of Systems Objective:   Vitals:   09/02/22 1333  BP: 120/70  Pulse: 90    General: Well developed, nourished, in no acute distress, alert and oriented x3   Dermatological: Skin is warm, dry and supple bilateral. Nails x 10 are well maintained; remaining integument appears unremarkable at this time. There are no open sores, no preulcerative lesions, no rash or signs of infection present.  Vascular: Dorsalis Pedis artery and Posterior Tibial artery pedal pulses are 2/4 bilateral with immedate capillary fill time. Pedal hair growth present. No varicosities and no lower extremity edema present bilateral.   Neruologic: Grossly intact via light touch bilateral. Vibratory intact via tuning fork bilateral. Protective threshold with Semmes Wienstein monofilament intact to all pedal sites bilateral. Patellar and Achilles deep tendon reflexes 2+ bilateral. No Babinski or clonus noted bilateral.   Musculoskeletal: No gross boney pedal deformities bilateral. No pain, crepitus, or limitation noted with foot and ankle range of motion bilateral. Muscular strength 5/5 in all groups tested bilateral.  Pain on palpation medial calcaneal tubercle of the right heel.  Gait: Unassisted, Nonantalgic.    Radiographs:  Radiographs taken  today demonstrate an osseously mature individual with walked large plantar distally oriented calcaneal heel spur and soft tissue increase in density at the plantar fascial calcaneal insertion site. Assessment & Plan:   Assessment: Planter fasciitis right foot.  Plan: Discussed etiology pathology conservative versus surgical therapies discussed appropriate shoe gear stretching  exercise ice therapy and shoe gear modification.  Start her on methylprednisolone to be followed by meloxicam.  I also injected the heel today 20 mg Kenalog 5 mg Marcaine point of maximal tenderness.  Placed in a plantar fascial brace to be followed by a night splint.  I would like to follow-up with her in 1 month to make sure she is doing well if she has questions or concerns she is notify us immediately.     Robley Matassa T. [place], [place]

## 2022-10-14 ENCOUNTER — Encounter: Payer: Self-pay | Admitting: Podiatry

## 2022-10-14 ENCOUNTER — Ambulatory Visit (INDEPENDENT_AMBULATORY_CARE_PROVIDER_SITE_OTHER): Payer: BC Managed Care – PPO | Admitting: Podiatry

## 2022-10-14 DIAGNOSIS — M722 Plantar fascial fibromatosis: Secondary | ICD-10-CM | POA: Diagnosis not present

## 2022-10-14 MED ORDER — TRIAMCINOLONE ACETONIDE 40 MG/ML IJ SUSP
20.0000 mg | Freq: Once | INTRAMUSCULAR | Status: AC
  Administered 2022-10-14: 20 mg

## 2022-10-14 NOTE — Progress Notes (Signed)
She presents today for follow-up of her Planter fasciitis of her right foot states that I got to about 2 good weeks out of it before it started to hurt again she states that she still about 60 to 70% improved.  Objective: Vital signs stable alert oriented x 3 there is no erythema edema salines drainage or odor.  She has pain on palpation medial came typical of her right heel.  It is not as firm and as painful as it was previously.  Assessment: Slowly resolving Planter fasciitis.  Plan: I recommended that she continue her anti-inflammatories and her plantar fascia brace I reinjected the right heel today 20 mg Kenalog milligrams Marcaine point of maximal tenderness.  Tolerated procedure well.

## 2022-11-25 ENCOUNTER — Encounter: Payer: Self-pay | Admitting: Podiatry

## 2022-11-25 ENCOUNTER — Ambulatory Visit (INDEPENDENT_AMBULATORY_CARE_PROVIDER_SITE_OTHER): Payer: BC Managed Care – PPO | Admitting: Podiatry

## 2022-11-25 DIAGNOSIS — S93691A Other sprain of right foot, initial encounter: Secondary | ICD-10-CM

## 2022-11-25 NOTE — Progress Notes (Signed)
She presents today for follow-up of her Planter fasciitis of her right foot.  States that still bother me quite a bit he really did not get any better since last visit.  She stated last time she was here it was approximately 60% improved.  Objective: Vital signs are stable alert oriented x 3.  There is no erythema cellulitis drainage odor she has pain on palpation medial calcaneal tubercle of the right heel.  Assessment: Planter fasciitis right appears to be intractable probable tear of the plantar fascia.  Plan: I and go to request an MRI at this point all conservative therapy has failed to alleviate this patient's symptomatology we have been treating this prior to the new year and conservative therapies have not got her to the point where she needs to be.  This is for surgical evaluation and differential diagnosis.

## 2022-12-02 ENCOUNTER — Encounter: Payer: Self-pay | Admitting: Podiatry

## 2022-12-14 ENCOUNTER — Ambulatory Visit
Admission: RE | Admit: 2022-12-14 | Discharge: 2022-12-14 | Disposition: A | Discharge status: Home / Self Care | Payer: BC Managed Care – PPO | Source: Ambulatory Visit | Attending: Podiatry | Admitting: Podiatry

## 2022-12-14 DIAGNOSIS — S93691A Other sprain of right foot, initial encounter: Secondary | ICD-10-CM

## 2022-12-17 ENCOUNTER — Telehealth: Payer: Self-pay | Admitting: *Deleted

## 2022-12-17 NOTE — Telephone Encounter (Signed)
[place] for disc, [place] for the over read , patient has been contacted thru voice message of delay-confirmation received 12/17/22.

## 2022-12-17 NOTE — Telephone Encounter (Signed)
-----   Message from Rip Harbour, [facility] sent at 12/16/2022  9:38 AM EDT -----  ----- Message ----- From: Garrel Ridgel, DPM Sent: 12/16/2022   7:42 AM EDT To: Rip Harbour, PMAC  Please send for an over read and inform patient of the delay.

## 2023-01-21 ENCOUNTER — Telehealth: Payer: Self-pay | Admitting: *Deleted

## 2023-01-21 NOTE — Telephone Encounter (Signed)
Patient is calling for results from MRI,overread report(in epic),please advise.

## 2023-02-05 ENCOUNTER — Ambulatory Visit (INDEPENDENT_AMBULATORY_CARE_PROVIDER_SITE_OTHER): Payer: BC Managed Care – PPO | Admitting: Podiatry

## 2023-02-05 DIAGNOSIS — M722 Plantar fascial fibromatosis: Secondary | ICD-10-CM | POA: Diagnosis not present

## 2023-02-05 MED ORDER — CELECOXIB 200 MG PO CAPS: 200.0000 mg | ORAL_CAPSULE | Freq: Two times a day (BID) | ORAL | 3 refills | Status: AC

## 2023-02-06 NOTE — Progress Notes (Signed)
She presents today for follow-up of her MRI and her right foot.  Objective: Vital signs stable tellurian x 3 MRI demonstrates chronic intractable Planter fasciitis no tears.  Assessment: Chronic proximal Planter fasciitis.  Plan: Discussed options with her which included physical therapy and surgical intervention.  At this point reducible to try shoe gear change and Celebrex 200 mg 1 tablet twice daily.  I will follow-up with her.

## 2023-09-05 ENCOUNTER — Other Ambulatory Visit: Payer: Self-pay | Admitting: Podiatry

## 2023-09-24 ENCOUNTER — Other Ambulatory Visit: Payer: Self-pay | Admitting: Podiatry

## 2024-03-25 ENCOUNTER — Other Ambulatory Visit (HOSPITAL_COMMUNITY): Payer: Self-pay | Admitting: Radiology

## 2024-03-25 DIAGNOSIS — J449 Chronic obstructive pulmonary disease, unspecified: Secondary | ICD-10-CM

## 2024-06-23 ENCOUNTER — Ambulatory Visit: Admitting: Podiatry

## 2024-06-23 ENCOUNTER — Encounter: Payer: Self-pay | Admitting: Podiatry

## 2024-06-23 ENCOUNTER — Ambulatory Visit (INDEPENDENT_AMBULATORY_CARE_PROVIDER_SITE_OTHER)

## 2024-06-23 DIAGNOSIS — S86311A Strain of muscle(s) and tendon(s) of peroneal muscle group at lower leg level, right leg, initial encounter: Secondary | ICD-10-CM | POA: Diagnosis not present

## 2024-06-23 DIAGNOSIS — S93691A Other sprain of right foot, initial encounter: Secondary | ICD-10-CM | POA: Diagnosis not present

## 2024-06-23 DIAGNOSIS — M7671 Peroneal tendinitis, right leg: Secondary | ICD-10-CM | POA: Diagnosis not present

## 2024-06-23 DIAGNOSIS — M722 Plantar fascial fibromatosis: Secondary | ICD-10-CM

## 2024-06-23 MED ORDER — METHYLPREDNISOLONE 4 MG PO TBPK: ORAL_TABLET | ORAL | 0 refills | Status: AC

## 2024-06-23 MED ORDER — TRIAMCINOLONE ACETONIDE 40 MG/ML IJ SUSP
40.0000 mg | Freq: Once | INTRAMUSCULAR | Status: AC
  Administered 2024-06-23: 40 mg

## 2024-06-27 NOTE — Progress Notes (Signed)
 She presents today chief complaint of severe pain to the lateral aspect of the right foot and leg times the past several months states that she may have rolled her ankle and since that time she has noticed some pain radiating up her leg.  She has not positive of any definitive trauma.  She still relates that her plantar fascia hurts on a daily basis she continues to wear her plantar fascia brace and taking her Celebrex .  She states that she can barely perform her daily activities she can barely put her foot to the floor.  Objective: Vital signs are stable alert and oriented x 3.  Pulses are palpable.  She has severe pain and warmth on palpation of the plantar fascia near its insertion site.  She has no tenderness on medial-lateral compression of the calcaneus though she does have severe tenderness on plantarflexion and eversion of the subtalar joint as well as abduction against resistance.  There is fluid on palpation of the peroneal tendon sheath.  There is also no tension on the medial band of the plantar fascia.  I think I can feel the central band however.  Radiographs taken today demonstrate an osseously mature individual significant swelling around the lateral side of the foot and soft tissue increase in density at the plantar fascial calcaneal insertion site.  Assessment possible tear of the plantar fascia and of the peroneal tendons right.  Plan: I injected the areas today with Kenalog  10 mg plantar fascia.  10 mg of Kenalog  to the peroneal tendon sheath.  Started her on methylprednisolone .  We are currently requesting an MRI of the right rear foot for evaluation of peroneal tendon injury and plantar fascial tear.  She has a boot at home that she will wear.  I will follow-up with her once the MRI is complete

## 2024-07-06 ENCOUNTER — Encounter (HOSPITAL_COMMUNITY): Payer: Self-pay

## 2024-07-06 ENCOUNTER — Ambulatory Visit (HOSPITAL_COMMUNITY): Admission: EM | Admit: 2024-07-06 | Discharge: 2024-07-06 | Disposition: A | Discharge status: Home / Self Care

## 2024-07-06 DIAGNOSIS — N390 Urinary tract infection, site not specified: Secondary | ICD-10-CM | POA: Diagnosis present

## 2024-07-06 DIAGNOSIS — M545 Low back pain, unspecified: Secondary | ICD-10-CM | POA: Insufficient documentation

## 2024-07-06 DIAGNOSIS — R319 Hematuria, unspecified: Secondary | ICD-10-CM | POA: Diagnosis not present

## 2024-07-06 LAB — POCT URINALYSIS DIP (MANUAL ENTRY)
Bilirubin, UA: NEGATIVE
Glucose, UA: >=1000 mg/dL — AB
Leukocytes, UA: NEGATIVE
Nitrite, UA: POSITIVE — AB
Protein Ur, POC: NEGATIVE mg/dL
Spec Grav, UA: 1.02 (ref 1.010–1.025)
Urobilinogen, UA: 0.2 U/dL
pH, UA: 5 (ref 5.0–8.0)

## 2024-07-06 MED ORDER — HYDROCODONE-ACETAMINOPHEN 5-325 MG PO TABS: 1.0000 | ORAL_TABLET | ORAL | 0 refills | Status: AC | PRN

## 2024-07-06 MED ORDER — CEPHALEXIN 500 MG PO CAPS: 500.0000 mg | ORAL_CAPSULE | Freq: Four times a day (QID) | ORAL | 0 refills | Status: AC

## 2024-07-06 NOTE — Discharge Instructions (Addendum)
 Return if any problems.  You have a urine culture pending.  See Dr. Benjamine for recheck in 1 week.  Return if symptoms worsen or change

## 2024-07-06 NOTE — ED Provider Notes (Signed)
 [facility]    CSN: [id number] Arrival date & time: 07/06/24  1152      History   Chief Complaint Chief Complaint  Patient presents with   Back Pain    HPI Wendy Ramsey is a 48 y.o. female.   Patient complains of pain in her back.  Patient reports that she has not had any injury she has not been lifting or straining.  Patient reports that the pain began several days ago but has progressively gotten worse.  Patient reports pain in the flank area.  Patient states  pain radiates around to her abdomen.  Patient denies any fever or chills she has not had any nausea or vomiting.  Patient denies any vaginal discharge she has not had any GYN problems.  The history is provided by the patient. No language interpreter was used.  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   Paroxysmal atrial fibrillation [facility] [place])     Patient Active Problem List   Diagnosis Date Noted   Herpes simplex 09/02/2022   Mass of ovary 09/02/2022   Reduced libido 09/02/2022   Tobacco user 09/02/2022   Status post laparoscopy 02/14/2014   Morbid obesity (HCC) 02/06/2014   Paroxysmal atrial fibrillation (HCC) 02/03/2014   Ovarian cyst, left 12/13/2013   Metrorrhagia 10/11/2012    Past Surgical History:  Procedure Laterality Date   CESAREAN SECTION     2 times    LAPAROSCOPIC BILATERAL SALPINGECTOMY Bilateral 02/14/2014   Procedure: LAPAROSCOPIC BILATERAL SALPINGECTOMY with cervical laceration repair;  Surgeon: Ovid DELENA All, MD;  Location: [facility];  Service: Gynecology;  Laterality: Bilateral;   LAPAROSCOPIC LYSIS OF ADHESIONS N/A 02/14/2014   Procedure: LAPAROSCOPIC LYSIS OF ADHESIONS;  Surgeon: Ovid DELENA All, MD;  Location: [facility];  Service: Gynecology;  Laterality: N/A;   LAPAROSCOPY N/A 02/14/2014   Procedure: LAPAROSCOPY OPERATIVE ;  Surgeon: Ovid DELENA All, MD;  Location: [facility];  Service: Gynecology;  Laterality: N/A;   TUBAL LIGATION      OB History     Gravida  2   Para  2   Term  2   Preterm      AB      Living  2      SAB      IAB      Ectopic      Multiple      Live Births               Home Medications    Prior to Admission medications   Medication Sig Start Date End Date Taking? Authorizing Provider  albuterol  (VENTOLIN  HFA) 108 (90 Base) MCG/ACT inhaler Inhale 1-2 puffs into the lungs every 4 (four) hours as needed for wheezing or shortness of breath. 07/11/21  Yes Van Knee, MD  celecoxib  (CELEBREX ) 200 MG capsule Take 1 capsule (200 mg total) by mouth 2 (two) times daily. 02/05/23  Yes Hyatt, Max T, DPM  cephALEXin (KEFLEX) 500 MG capsule Take 1 capsule (500 mg total) by mouth 4 (four) times daily. 07/06/24  Yes Jizelle Conkey K, PA-C  FARXIGA 10 MG TABS tablet Take 10 mg by mouth every morning. 06/30/24  Yes [provider]  HYDROcodone -acetaminophen  (NORCO/VICODIN) 5-325 MG tablet Take 1 tablet by mouth every 4 (four) hours as needed. 07/06/24 07/06/25 Yes Flint Sonny POUR,  PA-C  fluticasone  (FLONASE ) 50 MCG/ACT nasal spray Place 2 sprays into both nostrils daily. 07/11/21   Van Knee, MD  metFORMIN (GLUCOPHAGE) 500 MG tablet metformin 500 mg tablet  TAKE 1 TABLET BY MOUTH EVERY DAY    [provider]  methylPREDNISolone  (MEDROL  DOSEPAK) 4 MG TBPK tablet 6 day dose pack - take as directed 06/23/24   Verta Royden DASEN, DPM    Family History Family History  Problem Relation Age of Onset   Diabetes Maternal Grandmother    Cancer Maternal Grandfather    Cancer Father    Diabetes Mother     Social History Social History   Tobacco Use   Smoking status: Every Day    Current packs/day: 0.50    Average packs/day: 0.5 packs/day for 20.0 years (10.0 ttl pk-yrs)    Types: Cigarettes   Smokeless tobacco: Never  Substance Use Topics   Alcohol use: No   Drug use: No      Allergies   Patient has no known allergies.   Review of Systems Review of Systems  Musculoskeletal:  Positive for back pain.  All other systems reviewed and are negative.    Physical Exam Triage Vital Signs ED Triage Vitals  Encounter Vitals Group     BP 07/06/24 1318 114/78     Girls Systolic BP Percentile --      Girls Diastolic BP Percentile --      Boys Systolic BP Percentile --      Boys Diastolic BP Percentile --      Pulse Rate 07/06/24 1320 82     Resp 07/06/24 1318 18     Temp 07/06/24 1318 97.9 F (36.6 C)     Temp Source 07/06/24 1318 Oral     SpO2 07/06/24 1318 96 %     Weight --      Height --      Head Circumference --      Peak Flow --      Pain Score 07/06/24 1322 8     Pain Loc --      Pain Education --      Exclude from Growth Chart --    No data found.  Updated Vital Signs BP 114/78 (BP Location: Left Arm)   Pulse 82   Temp 97.9 F (36.6 C) (Oral)   Resp 18   LMP 06/29/2024 (Approximate)   SpO2 96%   Visual Acuity Right Eye Distance:   Left Eye Distance:   Bilateral Distance:    Right Eye Near:   Left Eye Near:    Bilateral Near:     Physical Exam Vitals and nursing note reviewed.  Constitutional:      Appearance: She is well-developed.  HENT:     Head: Normocephalic.  Cardiovascular:     Rate and Rhythm: Normal rate.  Pulmonary:     Effort: Pulmonary effort is normal.  Abdominal:     General: There is no distension.  Musculoskeletal:        General: Normal range of motion.     Cervical back: Normal range of motion.     Comments: Tender bilateral flank area, no rash,  Skin:    General: Skin is warm.  Neurological:     General: No focal deficit present.     Mental Status: She is alert and oriented to person, place, and time.      UC Treatments / Results  Labs (all labs ordered are listed, but only abnormal results are  displayed) Labs Reviewed  POCT URINALYSIS DIP (MANUAL ENTRY) - Abnormal; Notable for the  following components:      Result Value   Glucose, UA >=1,000 (*)    Ketones, POC UA small (15) (*)    Blood, UA moderate (*)    Nitrite, UA Positive (*)    All other components within normal limits  URINE CULTURE    EKG   Radiology No results found.  Procedures Procedures (including critical care time)  Medications Ordered in UC Medications - No data to display  Initial Impression / Assessment and Plan / UC Course  I have reviewed the triage vital signs and the nursing notes.  Pertinent labs & imaging results that were available during my care of the patient were reviewed by me and considered in my medical decision making (see chart for details).     Urinalysis is positive for blood ketones and nitrates.  Urine culture ordered.  I discussed pain with the patient.  I will start cephalexin for possible urinary tract infection.  Patient is given hydrocodone  for discomfort.  She is advised to schedule to see her primary care physician for recheck.  Return if symptoms worsen or change. Final Clinical Impressions(s) / UC Diagnoses   Final diagnoses:  Acute low back pain without sciatica, unspecified back pain laterality  Urinary tract infection with hematuria, site unspecified     Discharge Instructions      Return if any problems.  You have a urine culture pending.  See Dr. Benjamine for recheck in 1 week.  Return if symptoms worsen or change   ED Prescriptions     Medication Sig Dispense Auth. Provider   cephALEXin (KEFLEX) 500 MG capsule Take 1 capsule (500 mg total) by mouth 4 (four) times daily. 28 capsule Jorah Hua K, PA-C   HYDROcodone -acetaminophen  (NORCO/VICODIN) 5-325 MG tablet Take 1 tablet by mouth every 4 (four) hours as needed. 12 tablet Hawkin Charo K, PA-C      I have reviewed the PDMP during this encounter. An After Visit Summary was printed and given to the patient.       Flint Sonny POUR, PA-C 07/06/24 1606

## 2024-07-06 NOTE — Addendum Note (Signed)
 Addended by: Nilo Fallin on: 07/06/2024 02:47 PM   Modules accepted: Orders

## 2024-07-06 NOTE — ED Triage Notes (Signed)
 Patient presents with mid center back pain that started several days ago. Patient denies any trauma or injuries to her back.

## 2024-07-08 ENCOUNTER — Ambulatory Visit (HOSPITAL_COMMUNITY): Payer: Self-pay

## 2024-07-08 LAB — URINE CULTURE
Culture: >=100000 — AB
Special Requests: NORMAL

## 2024-07-11 ENCOUNTER — Ambulatory Visit (HOSPITAL_COMMUNITY)
Admission: RE | Admit: 2024-07-11 | Discharge: 2024-07-11 | Disposition: A | Discharge status: Home / Self Care | Source: Ambulatory Visit | Attending: Family Medicine | Admitting: Family Medicine

## 2024-07-11 DIAGNOSIS — J449 Chronic obstructive pulmonary disease, unspecified: Secondary | ICD-10-CM | POA: Insufficient documentation

## 2024-07-11 LAB — PULMONARY FUNCTION TEST
DL/VA % pred: 101 %
DL/VA: 4.46 ml/min/mmHg/L
DLCO unc % pred: 90 %
DLCO unc: 18.26 ml/min/mmHg
FEF 25-75 Post: 2.5 L/s
FEF 25-75 Pre: 2.38 L/s
FEF2575-%Change-Post: 4 %
FEF2575-%Pred-Post: 89 %
FEF2575-%Pred-Pre: 85 %
FEV1-%Change-Post: 0 %
FEV1-%Pred-Post: 85 %
FEV1-%Pred-Pre: 84 %
FEV1-Post: 2.33 L
FEV1-Pre: 2.31 L
FEV1FVC-%Change-Post: 0 %
FEV1FVC-%Pred-Pre: 100 %
FEV6-%Change-Post: 0 %
FEV6-%Pred-Post: 85 %
FEV6-%Pred-Pre: 85 %
FEV6-Post: 2.84 L
FEV6-Pre: 2.84 L
FEV6FVC-%Pred-Post: 102 %
FEV6FVC-%Pred-Pre: 102 %
FVC-%Change-Post: 0 %
FVC-%Pred-Post: 83 %
FVC-%Pred-Pre: 83 %
FVC-Post: 2.84 L
FVC-Pre: 2.84 L
Post FEV1/FVC ratio: 82 %
Post FEV6/FVC ratio: 100 %
Pre FEV1/FVC ratio: 81 %
Pre FEV6/FVC Ratio: 100 %
RV % pred: 107 %
RV: 1.78 L
TLC % pred: 98 %
TLC: 4.76 L

## 2024-07-11 MED ORDER — ALBUTEROL SULFATE (2.5 MG/3ML) 0.083% IN NEBU
2.5000 mg | INHALATION_SOLUTION | Freq: Once | RESPIRATORY_TRACT | Status: AC
  Administered 2024-07-11: 2.5 mg via RESPIRATORY_TRACT

## 2024-07-22 ENCOUNTER — Ambulatory Visit
Admission: RE | Admit: 2024-07-22 | Discharge: 2024-07-22 | Disposition: A | Discharge status: Home / Self Care | Source: Ambulatory Visit | Attending: Podiatry | Admitting: Podiatry

## 2024-07-22 DIAGNOSIS — S93691A Other sprain of right foot, initial encounter: Secondary | ICD-10-CM

## 2024-07-22 DIAGNOSIS — S86311A Strain of muscle(s) and tendon(s) of peroneal muscle group at lower leg level, right leg, initial encounter: Secondary | ICD-10-CM

## 2024-07-27 ENCOUNTER — Ambulatory Visit: Payer: Self-pay | Admitting: Podiatry

## 2024-07-27 ENCOUNTER — Other Ambulatory Visit: Payer: Self-pay | Admitting: Podiatry

## 2024-07-27 NOTE — Progress Notes (Signed)
 Spoke to patient is aware will schedule appointment.

## 2024-09-06 ENCOUNTER — Encounter: Payer: Self-pay | Admitting: Podiatry

## 2024-09-06 ENCOUNTER — Ambulatory Visit: Admitting: Podiatry

## 2024-09-06 DIAGNOSIS — M722 Plantar fascial fibromatosis: Secondary | ICD-10-CM

## 2024-09-06 DIAGNOSIS — M7671 Peroneal tendinitis, right leg: Secondary | ICD-10-CM

## 2024-09-07 NOTE — Progress Notes (Signed)
 She presents today for follow-up of her MRI results right foot.  She states that the right foot is still just as bad as it was.  States that it hurts all and here she points to the medial heel and lateral ankle.  States that even runs up the leg.  Objective: Vital signs are stable she is alert and oriented x 3.  Still has severe pain on palpation medial calcaneal tubercle  MRI indicates plantar fasciitis and peroneal tendinitis.  Does not demonstrate any tears.  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  Plan: Discussed the possible need for surgical intervention with her consisting of an endoscopic fasciotomy and synovectomy of the peroneal tendons.  I also offered her physical therapy to try first since there are no tears.  She would like to try this prior to surgical intervention so we will set her up for that.

## 2024-10-04 ENCOUNTER — Ambulatory Visit: Payer: Self-pay

## 2024-11-02 ENCOUNTER — Encounter: Payer: Self-pay | Admitting: Internal Medicine
